# Patient Record
Sex: Female | Born: 1985 | Race: White | Hispanic: No | Marital: Single | Smoking: Never smoker
Health system: Southern US, Community
[De-identification: ages and names within clinical notes are randomized; demographics above are authoritative.]

## PROBLEM LIST (undated history)

## (undated) DIAGNOSIS — Z0000 Encounter for general adult medical examination without abnormal findings: Secondary | ICD-10-CM

## (undated) HISTORY — PX: CHOLECYSTECTOMY: SHX55

## (undated) HISTORY — DX: Encounter for general adult medical examination without abnormal findings: Z00.00

---

## 2010-05-12 ENCOUNTER — Ambulatory Visit: Payer: Self-pay | Admitting: Family Medicine

## 2011-09-28 IMAGING — CR DG CHEST 1V
1 series · 1 of 1 positions shown · non-contrast
Comparison: none

REASON FOR EXAM: +PPD
COMMENTS:

[view not recorded]
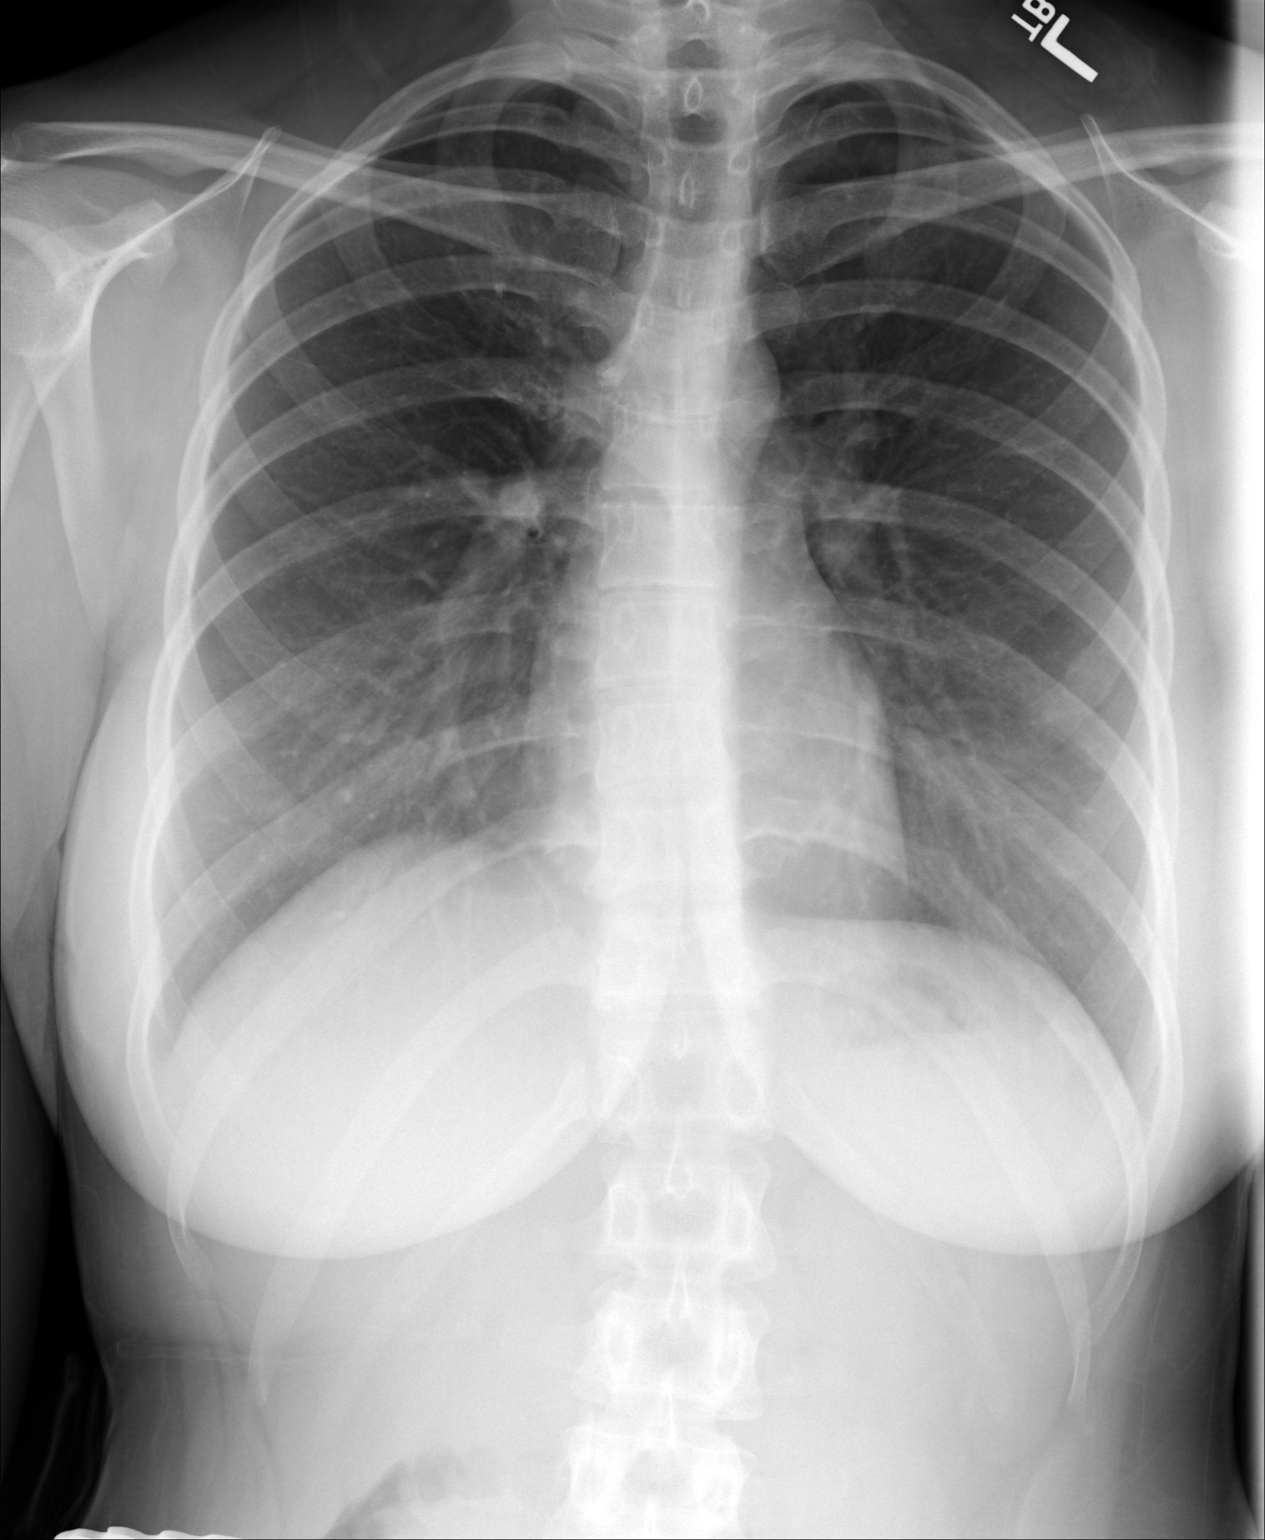

[1 of 1 positions shown; findings below may reference images not displayed]

PROCEDURE:     DXR - DXR CHEST 1 VIEWAP OR PA  - May 12, 2010  [DATE]

RESULT:     PA view of the chest shows the lung fields to be clear. The
heart, mediastinal and osseous structures show no acute changes. Attention
to the lung apices shows no infiltrate or cystic change suspicious for
tuberculosis. Incidental note is made of a slight thoracolumbar scoliosis.
IMPRESSION: 1.  No acute changes are identified.
2.  Incidental note is made of a slight thoracolumbar scoliosis.

## 2016-10-13 ENCOUNTER — Ambulatory Visit (INDEPENDENT_AMBULATORY_CARE_PROVIDER_SITE_OTHER): Payer: Self-pay | Admitting: Nurse Practitioner

## 2016-10-13 ENCOUNTER — Encounter: Payer: Self-pay | Admitting: Nurse Practitioner

## 2016-10-13 VITALS — BP 123/72 | HR 69 | Temp 98.3°F | Resp 16 | Ht 69.3 in | Wt 205.0 lb

## 2016-10-13 DIAGNOSIS — G4700 Insomnia, unspecified: Secondary | ICD-10-CM | POA: Diagnosis not present

## 2016-10-13 DIAGNOSIS — Z9189 Other specified personal risk factors, not elsewhere classified: Secondary | ICD-10-CM | POA: Diagnosis not present

## 2016-10-13 NOTE — Progress Notes (Signed)
Subjective:    Patient ID: Michele Hodge, female    DOB: 1985/12/25, 31 y.o.   MRN: [id number]  Michele Hodge is a 31 y.o. female presenting on 10/13/2016 for Establish Care (sleep study)   HPI   Over the last 2 years, Michele Hodge has had trouble sleeping with more difficulty over the last month.  Onset of trouble sleeping correlates with the same time she started gaining weight.  She gained between 40-45 pounds in 2 years, which she attributes relationship stress.  Since January 2018, she has lost 7 lbs on her home scale, but hasn't had any changes in her sleeping or snoring.  Highest weight 215 lbs; weight 2 years ago: 170-175  She does experience snoring, which has been reported by her former boyfriend and sister.  In January, her sister told her she was choking and it sounded like she stopped breathing.  Her former boyfriend had mentioned that she stopped breathing as well.  She has difficulty falling asleep and waking up in the night, but has no problems falling back asleep.  When she wakes up at night she feels short of breath and has a sore throat.  She has some daytime sleepiness (see Epworth) and is struggling to stay awake when driving on highway and at stoplights, but has not actually fallen asleep.  Pt describes it as "heavy eyes" and "I struggle to keep my eyes open."  [place] from [place] for [place], so this is worrisome to her.  She also notices difficulty focusing and she is "not present at work over the last month."   She has started waking up with headaches 1-2 x per week over the last several weeks that go away without taking any medication.   She denies increased moodiness or irritability, decreased libido, nocturia,and a history of GERD, PCOS, palpitations, or hypertension.  She does have a regular nighttime routine and goes to bed around the same time each night. She uses her cell phone up to the point of falling asleep.  Uses a sleep app which shows  her she has poor sleep quality.  The app is on her cell phone and sits by bed for monitoring restlessness - no physical monitor on body.  She purchased an adjustable bed and regularly sleeps on 1 pillow with the HOB elevated 10-20 degrees.  She admits she is a restless, side-sleeper.  Her mom uses a CPAP.   Epworth Sleepiness Scale Total Score: 17 Sitting and reading - 3 Watching TV - 2 Sitting inactive in a public place - 2 As a passenger in a car for an hour without a break - 3 Lying down to rest in the afternoon when circumstances permit - 3 Sitting and talking to someone - 1 Sitting quietly after a lunch without alcohol - 2 In a car, while stopped for a few minutes in traffic - 1  Chance of dozing off under normal circumstances 0 = Never 1 = Slight chance 2 = Moderate chance 3 = High chance  0-7: It is unlikely that you are abnormally sleepy. 8-9: Average amount of daytime sleepiness. >9: POSITIVE - Recommend further evaluation, sleep specialist or sleep study (>16-24 = severe)   History reviewed. No pertinent past medical history. History reviewed. No pertinent surgical history. Social History   Social History  . Marital status: Single    Spouse name: N/A  . Number of children: N/A  . Years of education: N/A   Occupational History  . Not  on file.   Social History Main Topics  . Smoking status: Never Smoker  . Smokeless tobacco: Current User  . Alcohol use 1.2 oz/week    2 Glasses of wine per week  . Drug use: No  . Sexual activity: No   Other Topics Concern  . Not on file   Social History Narrative  . No narrative on file   Family History  Problem Relation Age of Onset  . Diabetes Father 32  . Stroke Maternal Grandmother   . CAD Maternal Grandfather   . Stroke Paternal Grandmother   . Schizophrenia Brother 20  . Breast cancer Neg Hx   . Colon cancer Neg Hx   . Ovarian cancer Neg Hx    No current outpatient prescriptions on file prior to visit.    No current facility-administered medications on file prior to visit.    PAP and contraception management are done by her OBGYN and will continue there.  Review of Systems  Constitutional: Negative.   HENT: Positive for sore throat.   Eyes: Negative.   Respiratory: Negative.        Daytime/awake symptoms are negative.  See HPI for nocturnal/asleep symptoms.  Cardiovascular: Negative.   Gastrointestinal: Negative.   Endocrine: Negative.   Genitourinary: Negative.   Musculoskeletal: Negative.   Skin: Negative.   Allergic/Immunologic: Positive for environmental allergies.  Neurological: Positive for headaches.   Per HPI unless specifically indicated above      Objective:    BP 123/72 (BP Location: Right Arm, Patient Position: Sitting, Cuff Size: Large)   Pulse 69   Temp 98.3 F (36.8 C) (Oral)   Resp 16   Ht 5' 9.3" (1.76 m)   Wt 205 lb (93 kg)   BMI 30.01 kg/m   Wt Readings from Last 3 Encounters:  10/13/16 205 lb (93 kg)    Physical Exam  Constitutional: She is oriented to person, place, and time. She appears well-developed and well-nourished.  HENT:  Head: Normocephalic and atraumatic.  Right Ear: External ear normal.  Left Ear: External ear normal.  Nose: Nose normal.  Mouth/Throat: Oropharynx is clear and moist.  Mallampati score 3  Eyes: Conjunctivae and EOM are normal. Pupils are equal, round, and reactive to light.  Neck: Normal range of motion. Neck supple. No JVD present. No thyromegaly present.  Neck circumference 35.5 cm/14 in  Cardiovascular: Normal rate, regular rhythm, normal heart sounds and intact distal pulses.   Pulmonary/Chest: Effort normal and breath sounds normal.  Musculoskeletal: Normal range of motion.  Lymphadenopathy:    She has no cervical adenopathy.  Neurological: She is alert and oriented to person, place, and time. She has normal reflexes. No cranial nerve deficit. She exhibits normal muscle tone. Coordination normal.  Skin: Skin  is warm and dry.  Psychiatric: She has a normal mood and affect. Her behavior is normal. Judgment and thought content normal.    Epworth Sleepiness Scale  STOP-Bang OSA scoring: Intermediate OSA risk Snoring yes 1  Tiredness yes 1  Observed apneas yes 1  Pressure HTN no 0  BMI > 35 kg/m2 no 0  Age > 50  no 0  Neck (female >17 in; Female >16 in)  no 0  Gender female no 0  OSA risk low (0-2)  OSA risk intermediate (3-4)  OSA risk high (5+)  Total: 3    Assessment & Plan:   Problem List Items Addressed This Visit    None    Visit Diagnoses  At risk for obstructive sleep apnea  Insomnia, unspecified type    -  Primary   Relevant Orders   Nocturnal polysomnography (NPSG)  1. Referral for sleep study to evaluate nocturnal apnea 2. Discussed sleep hygiene, emphasizing the end of screen time 30 minutes before bed.        Follow up plan: Return in about 2 months (around 12/13/2016) for annual physical, follow-up sleep.  Wilhelmina Mcardle, DNP, AGNP-BC Adult Gerontology Nurse Practitioner [facility] 10/13/2016, 5:26 PM

## 2016-10-13 NOTE — Patient Instructions (Signed)
Michele Hodge, Thank you for coming in to clinic today.  1. Your insomnia is likely related to sleep apnea, but that diagnosis cannot be made without a sleep study.  2. Sleep Hygiene Recommendations to promote healthy sleep in all patients, especially if symptoms of insomnia are worsening. Due to the nature of sleep rhythms, if your body gets "out of rhythm", it may take some time before your sleep cycle can be "reset".  Please try to follow as many of the following tips as you can, usually there are only a few of these are the primary cause of the problem.  ?To reset your sleep rhythm, go to bed and get up at the same time every day ?Sleep only long enough to feel rested and then get out of bed ?Do not try to force yourself to sleep. If you can't sleep, get out of bed and try again later.  ?Have coffee, tea, and other foods that have caffeine only in the morning ?Exercise several days a week, but not right before bed ?If you drink alcohol, avoid alcohol in the late afternoon, evening, and bedtime ?If you smoke, avoid smoking, especially in the evening  ?Avoid watching TV or looking at phones, computers, or reading devices ("e-books") that give off light at least 30 minutes before bed. This artificial light sends "awake signals" to your brain and can make it harder to fall asleep. ?Keep your bedroom dark, cool, quiet, and free of reminders of other things that cause you stress ?Try your best to solve or at least address your problems before you go to bed  Please schedule a follow-up appointment with Michele Hodge, Michele Hodge in 1 month for annual wellness.  If you have any other questions or concerns, please feel free to call the clinic or send a message through MyChart. You may also schedule an earlier appointment if necessary.  Michele McardleLauren Everlie Eble, DNP, Michele Hodge-BC Adult Gerontology Nurse Practitioner [facility], [facility]    Sleep Studies A sleep study (polysomnogram) is a series of tests  done while you are sleeping. It can show how well you sleep. This can help your health care provider diagnose a sleep disorder and show how severe your sleep disorder is. A sleep study may lead to treatment that will help you sleep better and prevent other medical problems caused by poor sleep. If you have a sleep disorder, you may also be at risk for:  Sleep-related accidents.  High blood pressure.  Heart disease.  Stroke.  Other medical conditions. Sleep disorders are common. Your health care provider may suspect a sleep disorder if you:  Have loud snoring most nights.  Have brief periods when you stop breathing at night.  Feel sleepy on most days.  Fall asleep suddenly during the day.  Have trouble falling asleep or staying asleep.  Feel like you need to move your legs when trying to fall asleep.  Have dreams that seem very real shortly after falling asleep.  Feel like you cannot move when you first wake up. Which tests will I need to have? Most sleep studies last all night and include these tests:  Recordings of your brain activity.  Recordings of your eye movements.  Recording of your heart rate and rhythm.  Blood pressure readings.  Readings of the amount of oxygen in your blood.  Measurements of your chest and belly movement as you breathe during sleep. If you have signs of the sleep disorder called sleep apnea during your test, you may get  a mask to wear for the second half of the night.  The mask provides continuous positive airway pressure (CPAP). This may improve sleep apnea significantly.  You will then have all tests done again with the mask in place to see if your measurements and recordings change. How are sleep studies done? Most sleep studies are done over one full night of sleep.  You will arrive at the study center in the evening and can go home in the morning.  Bring your pajamas and toothbrush.  Do not have caffeine on the day of your sleep  study.  Your health care provider will let you know if you need to stop taking any of your regular medicines before the test. To do the tests included in a polysomnogram, you will have:  Round, sticky patches with sensors attached to recording wires (electrodes) placed on your scalp, face, chest, and limbs.  Wires from all the electrodes and sensors run from your bed to a computer. The wires can be taken off and put back on if you need to get out of bed to go to the bathroom.  A sensor placed over your nose to measure airflow.  A finger clip put on one finger to measure your blood oxygen level.  A belt around your belly and a belt around your chest to measure breathing movements. Where are sleep studies done? Sleep studies are done at sleep centers. A sleep center may be inside a hospital, office, or clinic. The room where you have the study may look like a hospital room or a hotel room. The health care providers doing the study may come in and out of the room during the study. Most of the time, they will be in another room monitoring your test. How is information from sleep studies helpful? A polysomnogram can be used along with your medical history and a physical exam to diagnose conditions, such as:  Sleep apnea.  Restless legs syndrome.  Sleep-related seizure disorders.  Sleep-related movement disorders. A medical doctor who specializes in sleep will evaluate your sleep study. The specialist will share the results with your primary health care provider. Treatments based on your sleep study may include:  Improving your sleep habits (sleep hygiene).  Wearing a CPAP mask.  Wearing an oral device at night to improve breathing and reduce snoring.  Taking medicine for:  Restless legs syndrome.  Sleep-related seizure disorder.  Sleep-related movement disorder. This information is not intended to replace advice given to you by your health care provider. Make sure you discuss any  questions you have with your health care provider. Document Released: 01/08/2003 Document Revised: 02/28/2016 Document Reviewed: 09/09/2013 Elsevier Interactive Patient Education  2017 [employer].

## 2016-10-13 NOTE — Progress Notes (Signed)
I have reviewed this encounter including the documentation in this note and/or discussed this patient with the provider, Wilhelmina McardleLauren Kennedy, AGPCNP-BC. I am certifying that I agree with the content of this note as supervising physician.  Saralyn PilarAlexander Cyan Clippinger, DO [facility] 10/13/2016, 6:24 PM

## 2016-10-25 ENCOUNTER — Ambulatory Visit (HOSPITAL_BASED_OUTPATIENT_CLINIC_OR_DEPARTMENT_OTHER): Payer: Self-pay

## 2016-11-03 ENCOUNTER — Telehealth: Payer: Self-pay | Admitting: *Deleted

## 2016-11-03 NOTE — Telephone Encounter (Signed)
Patient was approved for in-lab sleep study. Patient has appt scheduled but cancelled. Patient was informed home sleep study declined by insurance. She can call [facility] back to reschedule if she desires.

## 2016-12-13 ENCOUNTER — Encounter: Payer: BC Managed Care – PPO | Admitting: Nurse Practitioner

## 2016-12-26 ENCOUNTER — Encounter: Payer: Self-pay | Admitting: Nurse Practitioner

## 2016-12-26 ENCOUNTER — Ambulatory Visit (INDEPENDENT_AMBULATORY_CARE_PROVIDER_SITE_OTHER): Payer: BC Managed Care – PPO | Admitting: Nurse Practitioner

## 2016-12-26 VITALS — BP 110/59 | HR 62 | Ht 70.5 in | Wt 213.0 lb

## 2016-12-26 DIAGNOSIS — Z0000 Encounter for general adult medical examination without abnormal findings: Secondary | ICD-10-CM

## 2016-12-26 DIAGNOSIS — Z6830 Body mass index (BMI) 30.0-30.9, adult: Secondary | ICD-10-CM | POA: Diagnosis not present

## 2016-12-26 DIAGNOSIS — E663 Overweight: Secondary | ICD-10-CM | POA: Insufficient documentation

## 2016-12-26 DIAGNOSIS — Z7689 Persons encountering health services in other specified circumstances: Secondary | ICD-10-CM | POA: Diagnosis not present

## 2016-12-26 MED ORDER — SCOPOLAMINE 1 MG/3DAYS TD PT72: 1.0000 | MEDICATED_PATCH | TRANSDERMAL | 0 refills | Status: AC

## 2016-12-26 NOTE — Patient Instructions (Addendum)
Michele Hodge, Thank you for coming in to clinic today.  Normal physical exam today.  You will be due for FASTING BLOOD WORK this week (no food or drink after midnight before, only water or coffee without cream/sugar on the morning of)  Make an appointment for labs between 8-12am this week.  For Lab Results, once available within 2-3 days of blood draw, you can can log in to MyChart online to view your results and a brief explanation. Also, we can discuss results at next follow-up visit.  Recommend MyFitnessPal  - Calorie reduction about 500 calories from your baseline.  Please schedule a follow-up appointment with Michele Hodge, AGNP to Return in about 1 year (around 12/26/2017) for annual physical.  If you have any other questions or concerns, please feel free to call the clinic or send a message through MyChart. You may also schedule an earlier appointment if necessary.  Michele McardleLauren Rufus Beske, DNP, AGNP-BC Adult Gerontology Nurse Practitioner [facility], [facility]

## 2016-12-26 NOTE — Progress Notes (Signed)
Subjective:    Patient ID: Michele Hodge, female    DOB: June 10, 1986, 31 y.o.   MRN: [id number]  Michele Hodge is a 31 y.o. female presenting on 12/26/2016 for Annual Exam   HPI Sleep study has not been completed r/t cost.  In home study not covered. In office study covered, but w/ high copay for pt.  Currently feels better and is not as concerned about sleep apnea.  Symptoms improved after some weight loss and more exercise.  Patient has been feeling well.  They have acute concerns today as follows: - LMP started Saturday and is currently menstruating.  "Intense period pain" in April w/ cramping. Not repeated since.  Never on hormonal contraception - uses condoms.  Takes acetaminophen as needed for menstrual cramps.   - Cruise planned this summer for 6 nights: prior cruise of shorter duration w/ nausea and pt has concern about nausea again.  Dramamine not helpful in past for cruise.  Wants to discuss nausea medication options.  Health Maintenance Weight/BMI: High BMI - w/ desire to lose weight.  Lost a few pounds between last visit and her birthday, but has regained the weight and more (up 8 lbs since March visit) w/ dietary indiscretions. Physical activity: Zumba 1-2 x per week, workout at home 1x per week Diet: Felt better when eating more vegetables.  Now portion control w/ dietary indiscretions. Seatbelt: always Sunscreen: Always PAP: August 2017 Tetanus: 2011 Gardasil: not received - now not applicable r/t age  Encounter for STD testing Pt w/ no testing since last sexual partner.  Higher risk sexual behaviors w/ more than one partner in last 6 months and concern for partner to have been sexually active w/ another person.  Denies any vaginal discharge, odor, sores except single "pimple" noted recently on labia that is no longer present.  Past Medical History:  Diagnosis Date  . Healthy adult on routine physical examination     No prior surgeries.  Social History   Social  History  . Marital status: Single    Spouse name: N/A  . Number of children: N/A  . Years of education: N/A   Occupational History  . Not on file.   Social History Main Topics  . Smoking status: Never Smoker  . Smokeless tobacco: Current User  . Alcohol use 0.6 oz/week    1 Glasses of wine per week     Comment: occasional use  . Drug use: No  . Sexual activity: No   Other Topics Concern  . Not on file   Social History Narrative  . No narrative on file   Family History  Problem Relation Age of Onset  . Diabetes Father 61  . Stroke Maternal Grandmother   . CAD Maternal Grandfather   . Stroke Paternal Grandmother   . Schizophrenia Brother 20  . Breast cancer Neg Hx   . Colon cancer Neg Hx   . Ovarian cancer Neg Hx    No current outpatient prescriptions on file prior to visit.   No current facility-administered medications on file prior to visit.     Review of Systems  Constitutional: Negative.   HENT: Positive for tinnitus.        Tinnitus L>R  Eyes: Negative.   Respiratory: Negative.   Cardiovascular: Negative.   Gastrointestinal: Negative.   Endocrine: Negative.   Genitourinary: Negative.   Musculoskeletal: Negative.   Skin: Negative.   Allergic/Immunologic: Negative.   Neurological: Negative.   Hematological: Negative.   Psychiatric/Behavioral: Negative.  Per HPI unless specifically indicated above     Objective:    BP (!) 110/59 (BP Location: Right Arm, Patient Position: Sitting, Cuff Size: Large)   Pulse 62   Ht 5' 10.5" (1.791 m)   Wt 213 lb (96.6 kg)   LMP 12/24/2016   BMI 30.13 kg/m    Wt Readings from Last 3 Encounters:  12/26/16 213 lb (96.6 kg)  10/13/16 205 lb (93 kg)    Physical Exam  Constitutional: She is oriented to person, place, and time. She appears well-developed and well-nourished. No distress.  HENT:  Head: Normocephalic and atraumatic.  Right Ear: External ear normal.  Left Ear: External ear normal.  Nose: Nose  normal.  Mouth/Throat: Oropharynx is clear and moist. No oropharyngeal exudate.  Eyes: Conjunctivae and EOM are normal. Pupils are equal, round, and reactive to light.  Neck: Normal range of motion. Neck supple. No JVD present. No tracheal deviation present. No thyromegaly present.  Cardiovascular: Normal rate, regular rhythm, normal heart sounds and intact distal pulses.   Pulmonary/Chest: Effort normal and breath sounds normal. No respiratory distress.  Abdominal: Soft. Bowel sounds are normal. She exhibits no distension.  Musculoskeletal: Normal range of motion.  Lymphadenopathy:    She has no cervical adenopathy.  Neurological: She is alert and oriented to person, place, and time. No cranial nerve deficit.  Skin: Skin is warm and dry.  Psychiatric: She has a normal mood and affect. Her behavior is normal. Judgment and thought content normal.  Vitals reviewed.   No results found for this or any previous visit.    Assessment & Plan:   Problem List Items Addressed This Visit      Other   BMI 30.0-30.9,adult    Pt w/ weight gain of 8 lbs since March and obese BMI of 30.1 today.  Plan: 1. Reviewed diet and exercise plan. Discussed importance of committing to this plan daily. 2. Encouraged 1/2 - 1 lb weight loss per week. 3. Follow up w/ annual physical in 1 year.       Other Visit Diagnoses    Encounter for annual physical exam    -  Primary Normal well adult exam without new abnormal findings and no recent labs in past.  Denies prior cholesterol screening.  Plan: 1. Check Screening labs for thyroid function, cholesterol, CMP, kidney and liver function.   2. Check STD screening labs for HIV, RPR.   Relevant Orders   TSH   CBC with Differential/Platelet   Lipid panel   Comprehensive metabolic panel   HIV antibody   RPR   Encounter for assessment of STD exposure     Pt with high risk sexual encounters. No recent testing.  Plan: 1. Check GC/Chlamydia probe. 2. HIV  antibody, RPR serum labs.   Relevant Orders   HIV antibody   RPR   GC/Chlamydia Probe Amp      Meds ordered this encounter  Medications  . scopolamine (TRANSDERM-SCOP, 1.5 MG,) 1 MG/3DAYS    Sig: Place 1 patch (1.5 mg total) onto the skin every 3 (three) days. 12 hours prior to cruise place first patch behind ear    Dispense:  3 patch    Refill:  0    Order Specific Question:   Supervising Provider    Answer:   Smitty CordsKARAMALEGOS, ALEXANDER J [[id number]]     Follow up plan: Return in about 1 year (around 12/26/2017) for annual physical.  Michele McardleLauren Damondre Pfeifle, DNP, AGPCNP-BC Adult Gerontology Primary Care Nurse Practitioner  [facility] 12/26/2016, 12:08 PM

## 2016-12-26 NOTE — Progress Notes (Signed)
I have reviewed this encounter including the documentation in this note and/or discussed this patient with the provider, Wilhelmina McardleLauren Kennedy, AGPCNP-BC. I am certifying that I agree with the content of this note as supervising physician.  Saralyn PilarAlexander Adiva Boettner, DO [facility] 12/26/2016, 12:26 PM

## 2016-12-26 NOTE — Assessment & Plan Note (Addendum)
Pt w/ weight gain of 8 lbs since March and obese BMI of 30.1 today.  Plan: 1. Reviewed diet and exercise plan. Discussed importance of committing to this plan daily. 2. Encouraged 1/2 - 1 lb weight loss per week. 3. Follow up w/ annual physical in 1 year.

## 2016-12-27 LAB — GC/CHLAMYDIA PROBE AMP
CT Probe RNA: NOT DETECTED
GC Probe RNA: NOT DETECTED

## 2016-12-28 ENCOUNTER — Other Ambulatory Visit: Payer: BC Managed Care – PPO

## 2016-12-28 LAB — CBC WITH DIFFERENTIAL/PLATELET
Basophils Absolute: 69 cells/uL (ref 0–200)
Basophils Relative: 1 %
Eosinophils Absolute: 138 cells/uL (ref 15–500)
Eosinophils Relative: 2 %
HCT: 41.6 % (ref 35.0–45.0)
Hemoglobin: 13.9 g/dL (ref 11.7–15.5)
Lymphocytes Relative: 28 %
Lymphs Abs: 1932 cells/uL (ref 850–3900)
MCH: 30.7 pg (ref 27.0–33.0)
MCHC: 33.4 g/dL (ref 32.0–36.0)
MCV: 91.8 fL (ref 80.0–100.0)
MPV: 8.6 fL (ref 7.5–12.5)
Monocytes Absolute: 552 cells/uL (ref 200–950)
Monocytes Relative: 8 %
Neutro Abs: 4209 cells/uL (ref 1500–7800)
Neutrophils Relative %: 61 %
Platelets: 273 10*3/uL (ref 140–400)
RBC: 4.53 MIL/uL (ref 3.80–5.10)
RDW: 13.2 % (ref 11.0–15.0)
WBC: 6.9 10*3/uL (ref 3.8–10.8)

## 2016-12-28 LAB — COMPREHENSIVE METABOLIC PANEL
ALT: 25 U/L (ref 6–29)
AST: 22 U/L (ref 10–30)
Albumin: 4.4 g/dL (ref 3.6–5.1)
Alkaline Phosphatase: 47 U/L (ref 33–115)
BUN: 14 mg/dL (ref 7–25)
CO2: 24 mmol/L (ref 20–31)
Calcium: 9.3 mg/dL (ref 8.6–10.2)
Chloride: 104 mmol/L (ref 98–110)
Creat: 0.82 mg/dL (ref 0.50–1.10)
Glucose, Bld: 93 mg/dL (ref 65–99)
Potassium: 4.1 mmol/L (ref 3.5–5.3)
Sodium: 139 mmol/L (ref 135–146)
Total Bilirubin: 1.1 mg/dL (ref 0.2–1.2)
Total Protein: 7.3 g/dL (ref 6.1–8.1)

## 2016-12-28 LAB — LIPID PANEL
Cholesterol: 213 mg/dL — ABNORMAL HIGH (ref <200)
HDL: 57 mg/dL (ref >50)
LDL Cholesterol: 123 mg/dL — ABNORMAL HIGH (ref <100)
Total CHOL/HDL Ratio: 3.7 Ratio (ref <5.0)
Triglycerides: 166 mg/dL — ABNORMAL HIGH (ref <150)
VLDL: 33 mg/dL — ABNORMAL HIGH (ref <30)

## 2016-12-28 LAB — TSH: TSH: 1.87 mIU/L

## 2016-12-29 LAB — HIV ANTIBODY (ROUTINE TESTING W REFLEX): HIV 1&2 Ab, 4th Generation: NONREACTIVE

## 2016-12-29 LAB — RPR

## 2017-04-17 ENCOUNTER — Ambulatory Visit (INDEPENDENT_AMBULATORY_CARE_PROVIDER_SITE_OTHER): Payer: BC Managed Care – PPO | Admitting: Nurse Practitioner

## 2017-04-17 ENCOUNTER — Encounter: Payer: Self-pay | Admitting: Nurse Practitioner

## 2017-04-17 VITALS — BP 117/74 | HR 62 | Temp 98.3°F | Ht 70.5 in | Wt 212.0 lb

## 2017-04-17 DIAGNOSIS — Z6830 Body mass index (BMI) 30.0-30.9, adult: Secondary | ICD-10-CM | POA: Diagnosis not present

## 2017-04-17 DIAGNOSIS — K219 Gastro-esophageal reflux disease without esophagitis: Secondary | ICD-10-CM | POA: Diagnosis not present

## 2017-04-17 DIAGNOSIS — Z7689 Persons encountering health services in other specified circumstances: Secondary | ICD-10-CM

## 2017-04-17 DIAGNOSIS — Z30011 Encounter for initial prescription of contraceptive pills: Secondary | ICD-10-CM | POA: Diagnosis not present

## 2017-04-17 LAB — POCT WET PREP (WET MOUNT): Trichomonas Wet Prep HPF POC: ABSENT

## 2017-04-17 MED ORDER — NORETHIN-ETH ESTRAD-FE BIPHAS 1 MG-10 MCG / 10 MCG PO TABS: 1.0000 | ORAL_TABLET | Freq: Every day | ORAL | 11 refills | Status: DC

## 2017-04-17 MED ORDER — RANITIDINE HCL 75 MG PO TABS: 75.0000 mg | ORAL_TABLET | Freq: Two times a day (BID) | ORAL | 0 refills | Status: DC

## 2017-04-17 NOTE — Patient Instructions (Addendum)
Michele Hodge, Thank you for coming in to clinic today.  1. For acid reflux: - Limit meal especially dinner portions and avoid alcohol. - Take Zantac 75 mg twice daily for 2 weeks. Then, stop and use tums as needed. - Call clinic for additional instructions if needed.  2. For birth control: - Start lo-loestrin (or generic) pill once daily.  Start with new pill pack when complete.  Use backup birth control for first cycle.  3. For STD screening: - Will send copy of results to mychart.   Please schedule a follow-up appointment with Wilhelmina Mcardle, AGNP. Return in about 3 months (around 07/18/2017) for GERD.   If you have any other questions or concerns, please feel free to call the clinic or send a message through MyChart. You may also schedule an earlier appointment if necessary.  You will receive a survey after today's visit either digitally by e-mail or paper by [employer]. Your experiences and feedback matter to [place].  Please respond so we know how we are doing as we provide care for you.   Wilhelmina Mcardle, DNP, AGNP-BC Adult Gerontology Nurse Practitioner [facility], [facility]   Food Choices for Gastroesophageal Reflux Disease, Adult When you have gastroesophageal reflux disease (GERD), the foods you eat and your eating habits are very important. Choosing the right foods can help ease the discomfort of GERD. Consider working with a diet and nutrition specialist (dietitian) to help you make healthy food choices. What general guidelines should I follow? Eating plan  Choose healthy foods low in fat, such as fruits, vegetables, whole grains, low-fat dairy products, and lean meat, fish, and poultry.  Eat frequent, small meals instead of three large meals each day. Eat your meals slowly, in a relaxed setting. Avoid bending over or lying down until 2-3 hours after eating.  Limit high-fat foods such as fatty meats or fried foods.  Limit your intake of oils, butter, and shortening to  less than 8 teaspoons each day.  Avoid the following: ? Foods that cause symptoms. These may be different for different people. Keep a food diary to keep track of foods that cause symptoms. ? Alcohol. ? Drinking large amounts of liquid with meals. ? Eating meals during the 2-3 hours before bed.  Cook foods using methods other than frying. This may include baking, grilling, or broiling. Lifestyle   Maintain a healthy weight. Ask your health care provider what weight is healthy for you. If you need to lose weight, work with your health care provider to do so safely.  Exercise for at least 30 minutes on 5 or more days each week, or as told by your health care provider.  Avoid wearing clothes that fit tightly around your waist and chest.  Do not use any products that contain nicotine or tobacco, such as cigarettes and e-cigarettes. If you need help quitting, ask your health care provider.  Sleep with the head of your bed raised. Use a wedge under the mattress or blocks under the bed frame to raise the head of the bed. What foods are not recommended? The items listed may not be a complete list. Talk with your dietitian about what dietary choices are best for you. Grains Pastries or quick breads with added fat. [place] toast. Vegetables Deep fried vegetables. [place] fries. Any vegetables prepared with added fat. Any vegetables that cause symptoms. For some people this may include tomatoes and tomato products, chili peppers, onions and garlic, and horseradish. Fruits Any fruits prepared with  added fat. Any fruits that cause symptoms. For some people this may include citrus fruits, such as oranges, grapefruit, pineapple, and lemons. Meats and other protein foods High-fat meats, such as fatty beef or pork, hot dogs, ribs, ham, sausage, salami and bacon. Fried meat or protein, including fried fish and fried chicken. Nuts and nut butters. Dairy Whole milk and chocolate milk. Sour cream. Cream.  Ice cream. Cream cheese. Milk shakes. Beverages Coffee and tea, with or without caffeine. Carbonated beverages. Sodas. Energy drinks. Fruit juice made with acidic fruits (such as orange or grapefruit). Tomato juice. Alcoholic drinks. Fats and oils Butter. Margarine. Shortening. Ghee. Sweets and desserts Chocolate and cocoa. Donuts. Seasoning and other foods Pepper. Peppermint and spearmint. Any condiments, herbs, or seasonings that cause symptoms. For some people, this may include curry, hot sauce, or vinegar-based salad dressings. Summary  When you have gastroesophageal reflux disease (GERD), food and lifestyle choices are very important to help ease the discomfort of GERD.  Eat frequent, small meals instead of three large meals each day. Eat your meals slowly, in a relaxed setting. Avoid bending over or lying down until 2-3 hours after eating.  Limit high-fat foods such as fatty meat or fried foods. This information is not intended to replace advice given to you by your health care provider. Make sure you discuss any questions you have with your health care provider. Document Released: 07/04/2005 Document Revised: 07/05/2016 Document Reviewed: 07/05/2016 Elsevier Interactive Patient Education  2017 Elsevier Inc.    Intrauterine Device Information An intrauterine device (IUD) is inserted into your uterus to prevent pregnancy. There are two types of IUDs available:  Copper IUD-This type of IUD is wrapped in copper wire and is placed inside the uterus. Copper makes the uterus and fallopian tubes produce a fluid that kills sperm. The copper IUD can stay in place for 10 years.  Hormone IUD-This type of IUD contains the hormone progestin (synthetic progesterone). The hormone thickens the cervical mucus and prevents sperm from entering the uterus. It also thins the uterine lining to prevent implantation of a fertilized egg. The hormone can weaken or kill the sperm that get into the uterus. One  type of hormone IUD can stay in place for 5 years, and another type can stay in place for 3 years.  Your health care provider will make sure you are a good candidate for a contraceptive IUD. Discuss with your health care provider the possible side effects. Advantages of an intrauterine device  IUDs are highly effective, reversible, long acting, and low maintenance.  There are no estrogen-related side effects.  An IUD can be used when breastfeeding.  IUDs are not associated with weight gain.  The copper IUD works immediately after insertion.  The hormone IUD works right away if inserted within 7 days of your period starting. You will need to use a backup method of birth control for 7 days if the hormone IUD is inserted at any other time in your cycle.  The copper IUD does not interfere with your female hormones.  The hormone IUD can make heavy menstrual periods lighter and decrease cramping.  The hormone IUD can be used for 3 or 5 years.  The copper IUD can be used for 10 years. Disadvantages of an intrauterine device  The hormone IUD can be associated with irregular bleeding patterns.  The copper IUD can make your menstrual flow heavier and more painful.  You may experience cramping and vaginal bleeding after insertion. This information is not  intended to replace advice given to you by your health care provider. Make sure you discuss any questions you have with your health care provider. Document Released: 06/07/2004 Document Revised: 12/10/2015 Document Reviewed: 12/23/2012 Elsevier Interactive Patient Education  2017 [employer].

## 2017-04-17 NOTE — Progress Notes (Signed)
Subjective:    Patient ID: Michele Hodge, female    DOB: 1986-03-22, 31 y.o.   MRN: [id number]  Michele Hodge is a 31 y.o. female presenting on 04/17/2017 for Contraception; Gastroesophageal Reflux (belching, heartburn ); and Hot Flashes (pt feel its related to caffiene )   HPI Contraception/STD Wants to start contraception, but unaware of which kind she would like to start.  Has had multiple partners since last STD check and also wants pan STD screening.  Not currently on contraception method other than condoms.  Has irregular use of condoms.  Pt does not wish to have a pregnancy at this time.   GERD Belching and heart burn and pain in chest. Has been taking zantac 75 mg once daily.  Is already avoiding dairy.   No stomach pain noted. - Is eating large dinner meal often close to bedtime.  Tries to stay awake x 2 hours after dinner.  Will lie down sometimes before 2 hours have passed.  Breakfast: small meal/cereal/yogurt Lunch: small meal/snack Dinner: big meals meat, veggie, bread/ starch  Hot Flashes: Not most pressing concern, pt willing to defer until another visit.     Social History  Substance Use Topics  . Smoking status: Never Smoker  . Smokeless tobacco: Current User  . Alcohol use 0.6 oz/week    1 Glasses of wine per week     Comment: occasional use    Review of Systems Per HPI unless specifically indicated above     Objective:    BP 117/74 (BP Location: Right Arm, Patient Position: Sitting, Cuff Size: Large)   Pulse 62   Temp 98.3 F (36.8 C) (Oral)   Ht 5' 10.5" (1.791 m)   Wt 212 lb (96.2 kg)   LMP 03/29/2017 (Approximate)   BMI 29.99 kg/m   Wt Readings from Last 3 Encounters:  04/17/17 212 lb (96.2 kg)  12/26/16 213 lb (96.6 kg)  10/13/16 205 lb (93 kg)    Physical Exam  General - overweight, well-appearing, NAD HEENT - Normocephalic, atraumatic, PERRL, EOMI, patent nares w/o congestion, oropharynx clear, MMM Neck - supple, non-tender, no  LAD, no thyromegaly, no carotid bruit Heart - RRR, no murmurs heard Lungs - Clear throughout all lobes, no wheezing, crackles, or rhonchi. Normal work of breathing. Abdomen - soft, NTND, no masses, no hepatosplenomegaly, active bowel sounds GU - Normal external female genitalia without lesions or fusion. Vaginal canal without lesions. Normal appearing cervix without lesions or friability. Physiologic discharge on exam. Bimanual exam without adnexal masses, enlarged uterus, or cervical motion tenderness. Extremeties - non-tender, no edema, cap refill < 2 seconds, peripheral pulses intact +2 bilaterally Skin - warm, dry, no rashes Neuro - awake, alert, oriented x3, normal gait Psych - Normal mood and affect, normal behavior    Results for orders placed or performed in visit on 04/17/17  GC/Chlamydia Probe Amp  Result Value Ref Range   Chlamydia trachomatis, NAA Negative Negative   Neisseria gonorrhoeae by PCR Negative Negative  HIV antibody  Result Value Ref Range   HIV Screen 4th Generation wRfx Non Reactive Non Reactive  RPR  Result Value Ref Range   RPR Ser Ql Non Reactive Non Reactive  POCT Wet Prep ([place]et [place])  Result Value Ref Range   Source Wet Prep POC vagina    WBC, Wet Prep HPF POC few    Bacteria Wet Prep HPF POC Few Few   BACTERIA WET PREP MORPHOLOGY POC     Clue Cells Wet Prep  HPF POC None None   Clue Cells Wet Prep Whiff POC     Yeast Wet Prep HPF POC None    KOH Wet Prep POC     Trichomonas Wet Prep HPF POC Absent Absent      Assessment & Plan:   Problem List Items Addressed This Visit      Digestive   Gastroesophageal reflux disease    Acute to subacute nature of GERD symptoms characterized by excessive belching.  No emergency signs noted.  Symptoms worsening since weight gain, eating habits changing w/ late evening large meal and increased alcohol consumption.  Plan: 1. Discussed lifestyle modification of smaller meal in evening (>2 hr prior to bed),  avoiding alcohol, weight loss. 2. Start Zantac 75 mg twice daily for 2 weeks.  If no improvement, will trial PPI.  If symptoms improve, is GERD. 3. Can consider GI referral if needed. 4. Follow up 3-6 months.     Relevant Medications   ranitidine (ZANTAC) 75 MG tablet     Other   BMI 30.0-30.9,adult    Pt w/ stable weight, but remains overweight w/ BMI 29.99 today.  Plan: 1. Reviewed diet and exercise plan. Discussed importance of committing to this plan daily. 2. Encouraged 1/2 - 1 lb weight loss per week and goal of 15 lbs or greater for helping to improve acid reflux symptoms. 3. Follow up w/ annual physical and in 3 months.       Other Visit Diagnoses    Encounter for initial prescription of contraceptive pills    -  Primary Pt desires contraception and wants to discuss options today.   Patient's last menstrual period was 03/29/2017 (approximate).  Pt has had unprotected sex since LMP.   Plan: 1. Discussed OCP, patch, ring, implanon, IUD (hormonal and copper) in detail w/ common side effects of each. 2. Pt prefers to start OCP at this time.  Order placed for lo loestrin once daily. 3. Educated on common side effects associated w/ all estrogen hormone use. 4. Obtain urine pregnancy test today. 5.  Followup as needed and in 1 year.    Relevant Medications   Norethindrone-Ethinyl Estradiol-Fe Biphas (LO LOESTRIN FE) 1 MG-10 MCG / 10 MCG tablet   Encounter for assessment of STD exposure     High risk exposure w/ > 1 partner in last 6 months.    Plan: 1. Obtain vaginal swab GC/Chlamydia, blood tests HIV, RPR. 2. Counseled on safe sex practices.  OCP does not prevent STD. 3. Follow up as needed.   Relevant Orders   HIV antibody (Completed)   RPR (Completed)   GC/Chlamydia Probe Amp (Completed)   POCT Wet Prep [facility]) (Completed)      Meds ordered this encounter  Medications  . Norethindrone-Ethinyl Estradiol-Fe Biphas (LO LOESTRIN FE) 1 MG-10 MCG / 10 MCG tablet     Sig: Take 1 tablet by mouth daily.    Dispense:  1 Package    Refill:  11    Order Specific Question:   Supervising Provider    Answer:   Smitty Cords [[id number]]      Follow up plan: Return in about 3 months (around 07/18/2017) for GERD.  A total of 40 minutes was spent face-to-face with this patient. Greater than 50% of this time was spent in counseling and coordination of care with the patient regarding high risk sexual behaviors, contraception options, GERD triggers/treatment (pharm and lifestyle).   Wilhelmina Mcardle, DNP, AGPCNP-BC Adult Gerontology Primary Care  Nurse Practitioner [facility] 04/17/2017, 5:09 PM

## 2017-04-17 NOTE — Assessment & Plan Note (Signed)
Pt w/ stable weight, but remains overweight w/ BMI 29.99 today.  Plan: 1. Reviewed diet and exercise plan. Discussed importance of committing to this plan daily. 2. Encouraged 1/2 - 1 lb weight loss per week and goal of 15 lbs or greater for helping to improve acid reflux symptoms. 3. Follow up w/ annual physical and in 3 months.

## 2017-04-20 LAB — GC/CHLAMYDIA PROBE AMP
Chlamydia trachomatis, NAA: NEGATIVE
Neisseria gonorrhoeae by PCR: NEGATIVE

## 2017-04-22 LAB — RPR: RPR Ser Ql: NONREACTIVE

## 2017-04-22 LAB — HIV ANTIBODY (ROUTINE TESTING W REFLEX): HIV Screen 4th Generation wRfx: NONREACTIVE

## 2017-04-24 NOTE — Assessment & Plan Note (Addendum)
Acute to subacute nature of GERD symptoms characterized by excessive belching.  No emergency signs noted.  Symptoms worsening since weight gain, eating habits changing w/ late evening large meal and increased alcohol consumption.  Plan: 1. Discussed lifestyle modification of smaller meal in evening (>2 hr prior to bed), avoiding alcohol, weight loss. 2. Start Zantac 75 mg twice daily for 2 weeks.  If no improvement, will trial PPI.  If symptoms improve, is GERD. 3. Can consider GI referral if needed. 4. Follow up 3-6 months.

## 2017-08-28 ENCOUNTER — Ambulatory Visit: Payer: BC Managed Care – PPO | Admitting: Nurse Practitioner

## 2017-08-29 ENCOUNTER — Encounter: Payer: Self-pay | Admitting: Nurse Practitioner

## 2017-08-29 ENCOUNTER — Ambulatory Visit: Payer: BC Managed Care – PPO | Admitting: Nurse Practitioner

## 2017-08-29 VITALS — BP 126/73 | HR 68 | Temp 98.7°F | Ht 70.5 in | Wt 224.4 lb

## 2017-08-29 DIAGNOSIS — Z114 Encounter for screening for human immunodeficiency virus [HIV]: Secondary | ICD-10-CM

## 2017-08-29 DIAGNOSIS — B9689 Other specified bacterial agents as the cause of diseases classified elsewhere: Secondary | ICD-10-CM | POA: Diagnosis not present

## 2017-08-29 DIAGNOSIS — N760 Acute vaginitis: Secondary | ICD-10-CM | POA: Diagnosis not present

## 2017-08-29 DIAGNOSIS — J029 Acute pharyngitis, unspecified: Secondary | ICD-10-CM

## 2017-08-29 DIAGNOSIS — Z113 Encounter for screening for infections with a predominantly sexual mode of transmission: Secondary | ICD-10-CM | POA: Diagnosis not present

## 2017-08-29 LAB — POCT RAPID STREP A (OFFICE): Rapid Strep A Screen: NEGATIVE

## 2017-08-29 MED ORDER — METRONIDAZOLE 0.75 % EX GEL: 1.0000 "application " | Freq: Every day | CUTANEOUS | 0 refills | Status: AC

## 2017-08-29 MED ORDER — METRONIDAZOLE 0.75 % VA GEL: 1.0000 | Freq: Every day | VAGINAL | 0 refills | Status: AC

## 2017-08-29 NOTE — Progress Notes (Signed)
Subjective:    Patient ID: Michele Hodge, female    DOB: September 14, 1985, 32 y.o.   MRN: [id number]  Michele Hodge is a 32 y.o. female presenting on 08/29/2017 for Sore Throat (difficulty swollen, tired & fatigue  x 5 days) and Rash (intermttent rash over the left nipple on the breast)   HPI Sore Throat Sore throat started Thursday gradual.  Tickle.  Hard to eat solid foods Friday.  Slept Sat-Sun.  Swollen tonsils/glands that are now improving.  - No cough. - No sneezing,  - No chills, fever, sweats - Improving today.     Rash left breast Started after 3 hr after epsom salt baths for relaxation.  Now has dots/patches on arms, occasionally on legs.  Uses 2 different bath bombs/candle in area.  Is sharing with roommate's bathtub.  Now has complete circle above L nipple.    STD Pt reports some mild vaginal discharge, no new odor, new bumps suprapubically in pubic hair.  - 1 sexual partner in last 6 months.  3 partners since last STD testing.   Social History   Tobacco Use  . Smoking status: Never Smoker  . Smokeless tobacco: Current User  Substance Use Topics  . Alcohol use: Yes    Alcohol/week: 0.6 oz    Types: 1 Glasses of wine per week    Comment: occasional use  . Drug use: No    Review of Systems Per HPI unless specifically indicated above     Objective:    BP 126/73 (BP Location: Right Arm, Patient Position: Sitting, Cuff Size: Large)   Pulse 68   Temp 98.7 F (37.1 C) (Oral)   Ht 5' 10.5" (1.791 m)   Wt 224 lb 6.4 oz (101.8 kg)   BMI 31.74 kg/m    Wt Readings from Last 3 Encounters:  08/29/17 224 lb 6.4 oz (101.8 kg)  04/17/17 212 lb (96.2 kg)  12/26/16 213 lb (96.6 kg)    Physical Exam  Constitutional: She is oriented to person, place, and time. She appears well-developed and well-nourished. No distress.  HENT:  Head: Normocephalic and atraumatic.  Right Ear: Hearing, tympanic membrane, external ear and ear canal normal.  Left Ear: Hearing, tympanic  membrane, external ear and ear canal normal.  Nose: Mucosal edema and rhinorrhea present. Right sinus exhibits no maxillary sinus tenderness and no frontal sinus tenderness. Left sinus exhibits no maxillary sinus tenderness and no frontal sinus tenderness.  Mouth/Throat: Uvula is midline and mucous membranes are normal. Posterior oropharyngeal erythema (mild) present. No oropharyngeal exudate (clear secretions), posterior oropharyngeal edema or tonsillar abscesses.  Neck: Normal range of motion. Neck supple.  Cardiovascular: Normal rate, regular rhythm, S1 normal, S2 normal, normal heart sounds and intact distal pulses.  Pulmonary/Chest: Effort normal and breath sounds normal. No respiratory distress.  Genitourinary:  Genitourinary Comments: Normal external female genitalia without lesions or fusion. Vaginal canal without lesions. Normal appearing cervix without lesions or friability. Thin, white discharge on exam with amine odor. Bimanual exam without adnexal masses, enlarged uterus, or cervical motion tenderness.  Lymphadenopathy:    She has cervical adenopathy.  Neurological: She is alert and oriented to person, place, and time.  Skin: Skin is warm and dry. Rash noted.     Psychiatric: She has a normal mood and affect. Her behavior is normal. Judgment and thought content normal.  Vitals reviewed.    Results for orders placed or performed in visit on 08/29/17  C. trachomatis/N. gonorrhoeae RNA  Result Value Ref Range  C. trachomatis RNA, TMA NOT DETECTED NOT DETECT   N. gonorrhoeae RNA, TMA NOT DETECTED NOT DETECT  C. trachomatis/N. gonorrhoeae RNA  Result Value Ref Range   C. trachomatis RNA, TMA NOT DETECTED NOT DETECT   N. gonorrhoeae RNA, TMA NOT DETECTED NOT DETECT  POCT rapid strep A  Result Value Ref Range   Rapid Strep A Screen Negative Negative      Assessment & Plan:   Problem List Items Addressed This Visit    None    Visit Diagnoses    Sore throat    -   Primary Consistent with acute pharyngitis, suspected viral etiology without constellation of viral symptoms and no sick contacts.  No significant history of strep throat, no recent antibiotics. Afebrile and no focal signs of infection (TM's clear, lungs clear). Tolerating PO well.  Cannot exclude oral STD.  Plan: 1. Rapid strep negative today. Reassurance, likely viral etiology of pharyngitis, should be self limited - Swab for oral GC/Chlamydia 2. Symptomatic control with NSAID / Tylenol regularly then PRN 3. Improve hydration, advance diet, warm herbal tea with honey 4. Consider trial Nasal saline, Flonase, anti-histamines to resolve post-nasal drip 5. Follow-up if worsening within 1 week and GC/Chlamydia are negative   Relevant Orders   POCT rapid strep A (Completed)   C. trachomatis/N. gonorrhoeae RNA (Completed)   Screening for STD (sexually transmitted disease)     Pt with continued high risk sexual behaviors and new concern for STD with increased vaginal discharge and odor.  Exam c/w BV and confirmed on Wet Prep.  No Trichomoniasis.  Plan: 1. GC/Chlamydia swab 2. Wet Prep 3. HIV/RPR testing as well. 4. Counseled on safe sex practices. 5. Followup as needed.    Relevant Orders   C. trachomatis/N. gonorrhoeae RNA (Completed)   C. trachomatis/N. gonorrhoeae RNA (Completed)   HIV antibody   RPR   Encounter for screening for HIV       Relevant Orders   HIV antibody   BV (bacterial vaginosis)     Clinically consistent with vaginal discharge on exam. Confirmed with BV on wet prep with clue cells and positive whiff test.  Plan: 1. Start Metronidazole 500mg  BID x 7 days (avoid alcohol) 2. Counseled on reducing recurrences with condom use, may try yogurt, probiotics, alternatively some patients are prone to recurrences with certain partners. 3. Additionally given rx Diflucan to fill if develops subsequent yeast infection       Meds ordered this encounter  Medications  .  metroNIDAZOLE (METROGEL) 0.75 % gel    Sig: Apply 1 application topically daily for 5 days.    Dispense:  45 g    Refill:  0    Order Specific Question:   Supervising Provider    Answer:   Smitty Cords [[id number]]  . metroNIDAZOLE (METROGEL VAGINAL) 0.75 % vaginal gel    Sig: Place 1 Applicatorful vaginally daily for 5 days.    Dispense:  70 g    Refill:  0      Follow up plan: Return 5-7 days if symptoms worsen or fail to improve.  Wilhelmina Mcardle, DNP, AGPCNP-BC Adult Gerontology Primary Care Nurse Practitioner [facility] 09/13/2017, 9:50 PM

## 2017-08-29 NOTE — Patient Instructions (Addendum)
Michele Hodge, Thank you for coming in to clinic today.  1. START reducing your bathing time.  Your skin patches are possibly dry skin.  Your breast appears to be tinea versicolor.  START using lotrimin cream topically 2 x daily for 14 days.  2. STD tests will be called to you when they result.  3. Reduce your sexual exposures.  4. START metronidazole place one applicatorful nightly for 5 nights.  Please schedule a follow-up appointment with Michele Hodge, AGNP. Return 5-7 days if symptoms worsen or fail to improve.  If you have any other questions or concerns, please feel free to call the clinic or send a message through MyChart. You may also schedule an earlier appointment if necessary.  You will receive a survey after today's visit either digitally by e-mail or paper by [employer] mail. Your experiences and feedback matter to us.  Please respond so we know how we are doing as we provide care for you.   Michele McardleLauren Zanasia Hickson, DNP, AGNP-BC Adult Gerontology Nurse Practitioner [facility], [facility]

## 2017-08-30 LAB — C. TRACHOMATIS/N. GONORRHOEAE RNA
C. trachomatis RNA, TMA: NOT DETECTED
C. trachomatis RNA, TMA: NOT DETECTED
N. gonorrhoeae RNA, TMA: NOT DETECTED
N. gonorrhoeae RNA, TMA: NOT DETECTED

## 2017-09-05 ENCOUNTER — Telehealth: Payer: Self-pay | Admitting: Nurse Practitioner

## 2017-09-05 ENCOUNTER — Encounter: Payer: Self-pay | Admitting: Nurse Practitioner

## 2017-09-05 DIAGNOSIS — B3731 Acute candidiasis of vulva and vagina: Secondary | ICD-10-CM

## 2017-09-05 DIAGNOSIS — B373 Candidiasis of vulva and vagina: Secondary | ICD-10-CM

## 2017-09-05 MED ORDER — FLUCONAZOLE 150 MG PO TABS: 150.0000 mg | ORAL_TABLET | Freq: Once | ORAL | 0 refills | Status: DC

## 2017-09-05 NOTE — Telephone Encounter (Signed)
Pt called, reported thick, "cottage cheese" like vaginal discharge.  Asked if she should continue metrogel.    - Pt should continue metrogel for final doses. - Will also send diflucan 150 mg x 1 dose.

## 2017-09-12 ENCOUNTER — Telehealth: Payer: Self-pay | Admitting: Nurse Practitioner

## 2017-09-12 DIAGNOSIS — B373 Candidiasis of vulva and vagina: Secondary | ICD-10-CM

## 2017-09-12 DIAGNOSIS — B3731 Acute candidiasis of vulva and vagina: Secondary | ICD-10-CM

## 2017-09-12 MED ORDER — FLUCONAZOLE 150 MG PO TABS: 150.0000 mg | ORAL_TABLET | Freq: Once | ORAL | 0 refills | Status: AC

## 2017-09-12 NOTE — Telephone Encounter (Signed)
Pt has yeast infection from medication and asked to have something called to pharmacy.  Her call back number is [phone]

## 2017-09-12 NOTE — Telephone Encounter (Signed)
Pt had not gotten original diflucan prescription 09/05/2017. [facility] did not have the prescription when she called there today.  Prescription for single dose resent today.

## 2017-09-13 ENCOUNTER — Encounter: Payer: Self-pay | Admitting: Nurse Practitioner

## 2017-11-16 ENCOUNTER — Encounter: Payer: Self-pay | Admitting: Nurse Practitioner

## 2017-12-29 ENCOUNTER — Emergency Department: Payer: BC Managed Care – PPO

## 2017-12-29 ENCOUNTER — Emergency Department
Admission: EM | Admit: 2017-12-29 | Discharge: 2017-12-29 | Disposition: A | Discharge status: Home / Self Care | Payer: BC Managed Care – PPO | Attending: Emergency Medicine | Admitting: Emergency Medicine

## 2017-12-29 ENCOUNTER — Encounter: Payer: Self-pay | Admitting: Emergency Medicine

## 2017-12-29 ENCOUNTER — Other Ambulatory Visit: Payer: Self-pay

## 2017-12-29 ENCOUNTER — Telehealth: Payer: Self-pay | Admitting: Nurse Practitioner

## 2017-12-29 DIAGNOSIS — Z79899 Other long term (current) drug therapy: Secondary | ICD-10-CM | POA: Insufficient documentation

## 2017-12-29 DIAGNOSIS — R1011 Right upper quadrant pain: Secondary | ICD-10-CM | POA: Diagnosis not present

## 2017-12-29 DIAGNOSIS — K8020 Calculus of gallbladder without cholecystitis without obstruction: Secondary | ICD-10-CM

## 2017-12-29 DIAGNOSIS — R1110 Vomiting, unspecified: Secondary | ICD-10-CM

## 2017-12-29 LAB — CBC WITH DIFFERENTIAL/PLATELET
BASOS ABS: 0.1 10*3/uL (ref 0–0.1)
BASOS PCT: 1 %
Eosinophils Absolute: 0 10*3/uL (ref 0–0.7)
Eosinophils Relative: 0 %
HEMATOCRIT: 41.8 % (ref 35.0–47.0)
HEMOGLOBIN: 14.2 g/dL (ref 12.0–16.0)
LYMPHS PCT: 15 %
Lymphs Abs: 1.9 10*3/uL (ref 1.0–3.6)
MCH: 30.5 pg (ref 26.0–34.0)
MCHC: 34 g/dL (ref 32.0–36.0)
MCV: 89.8 fL (ref 80.0–100.0)
Monocytes Absolute: 0.5 10*3/uL (ref 0.2–0.9)
Monocytes Relative: 4 %
NEUTROS ABS: 10.3 10*3/uL — AB (ref 1.4–6.5)
NEUTROS PCT: 80 %
Platelets: 304 10*3/uL (ref 150–440)
RBC: 4.66 MIL/uL (ref 3.80–5.20)
RDW: 12.4 % (ref 11.5–14.5)
WBC: 12.9 10*3/uL — AB (ref 3.6–11.0)

## 2017-12-29 LAB — COMPREHENSIVE METABOLIC PANEL
ALBUMIN: 4.5 g/dL (ref 3.5–5.0)
ALT: 28 U/L (ref 14–54)
AST: 24 U/L (ref 15–41)
Alkaline Phosphatase: 41 U/L (ref 38–126)
Anion gap: 10 (ref 5–15)
BILIRUBIN TOTAL: 0.6 mg/dL (ref 0.3–1.2)
BUN: 12 mg/dL (ref 6–20)
CO2: 24 mmol/L (ref 22–32)
CREATININE: 0.91 mg/dL (ref 0.44–1.00)
Calcium: 9.4 mg/dL (ref 8.9–10.3)
Chloride: 105 mmol/L (ref 101–111)
GFR calc Af Amer: >60 mL/min (ref >60)
GLUCOSE: 127 mg/dL — AB (ref 65–99)
Potassium: 3.5 mmol/L (ref 3.5–5.1)
Sodium: 139 mmol/L (ref 135–145)
TOTAL PROTEIN: 7.8 g/dL (ref 6.5–8.1)

## 2017-12-29 LAB — LIPASE, BLOOD: LIPASE: 31 U/L (ref 11–51)

## 2017-12-29 MED ORDER — MORPHINE SULFATE (PF) 4 MG/ML IV SOLN
4.0000 mg | Freq: Once | INTRAVENOUS | Status: AC
  Administered 2017-12-29: 4 mg via INTRAVENOUS

## 2017-12-29 MED ORDER — MORPHINE SULFATE (PF) 4 MG/ML IV SOLN
INTRAVENOUS | Status: AC
  Filled 2017-12-29: qty 1

## 2017-12-29 MED ORDER — ONDANSETRON HCL 4 MG/2ML IJ SOLN
4.0000 mg | Freq: Once | INTRAMUSCULAR | Status: AC
  Administered 2017-12-29: 4 mg via INTRAVENOUS
  Filled 2017-12-29: qty 2

## 2017-12-29 MED ORDER — IBUPROFEN 200 MG PO TABS: 600.0000 mg | ORAL_TABLET | Freq: Three times a day (TID) | ORAL | 0 refills | Status: DC | PRN

## 2017-12-29 MED ORDER — ONDANSETRON 4 MG PO TBDP: 4.0000 mg | ORAL_TABLET | Freq: Three times a day (TID) | ORAL | 0 refills | Status: DC | PRN

## 2017-12-29 MED ORDER — OXYCODONE-ACETAMINOPHEN 5-325 MG PO TABS
1.0000 | ORAL_TABLET | Freq: Once | ORAL | Status: AC
  Administered 2017-12-29: 1 via ORAL
  Filled 2017-12-29: qty 1

## 2017-12-29 MED ORDER — BACLOFEN 10 MG PO TABS: 10.0000 mg | ORAL_TABLET | Freq: Three times a day (TID) | ORAL | 0 refills | Status: DC | PRN

## 2017-12-29 MED ORDER — MORPHINE SULFATE (PF) 2 MG/ML IV SOLN
2.0000 mg | Freq: Once | INTRAVENOUS | Status: AC
  Administered 2017-12-29: 2 mg via INTRAVENOUS
  Filled 2017-12-29: qty 1

## 2017-12-29 MED ORDER — OXYCODONE-ACETAMINOPHEN 5-325 MG PO TABS: 1.0000 | ORAL_TABLET | ORAL | 0 refills | Status: DC | PRN

## 2017-12-29 MED ORDER — HYDROMORPHONE HCL 1 MG/ML IJ SOLN
1.0000 mg | Freq: Once | INTRAMUSCULAR | Status: AC
  Administered 2017-12-29: 1 mg via INTRAVENOUS

## 2017-12-29 MED ORDER — HYDROMORPHONE HCL 1 MG/ML IJ SOLN
INTRAMUSCULAR | Status: AC
  Administered 2017-12-29: 1 mg via INTRAVENOUS
  Filled 2017-12-29: qty 1

## 2017-12-29 MED ORDER — SODIUM CHLORIDE 0.9 % IV BOLUS
1000.0000 mL | Freq: Once | INTRAVENOUS | Status: AC
  Administered 2017-12-29: 1000 mL via INTRAVENOUS

## 2017-12-29 MED ORDER — MORPHINE SULFATE (PF) 4 MG/ML IV SOLN: 4.0000 mg | Freq: Once | INTRAVENOUS | Status: DC

## 2017-12-29 NOTE — Telephone Encounter (Signed)
Pt needs referral to surgeon for gall bladder [phone]

## 2017-12-29 NOTE — Telephone Encounter (Signed)
Call placed to pt.  Last took med at 9:20 am and has had no pain relief.   - Took one at hospital with mild relief, but was after Dilaudid dose.  START ibuprofen 600 mg tid prn pain as this has higher evidence for pain relief in acute biliary colic.  START baclofen 10 mg tid for possible relief from spasticity.  Do not take with opioid.  Pt verbalizes understanding.  Reviewed s/sx of gall bladder emergency.  If needing additional care over weekend, should seek care in Urgent Care or ER again.    Pt also prefers alternative surgeon if possible.  Less important to have sooner appointment after discussion.  [facility]

## 2017-12-29 NOTE — ED Triage Notes (Addendum)
Pt presents to ED with sudden onset of mid abd pain since around midnight. Vomiting X2. Diarrhea X1 earlier today. Pain in RUQ with palpation. OTC medications taken with no relief.

## 2017-12-29 NOTE — ED Provider Notes (Signed)
[facility] Emergency Department Provider Note    First MD Initiated Contact with Patient 12/29/17 [phone]     (approximate)  I have reviewed the triage vital signs and the nursing notes.   HISTORY  Chief Complaint Abdominal Pain    HPI Michele Hodge is a 32 y.o. female no past medical history presents to the emergency department acute onset of right upper quadrant abdominal pain associate with vomiting which began at midnight tonight.  Patient states current pain score 10 out of 10.  Patient denies any fever.  Denies any urinary symptoms   Past Medical History:  Diagnosis Date  . Healthy adult on routine physical examination     Patient Active Problem List   Diagnosis Date Noted  . Gastroesophageal reflux disease 04/17/2017  . BMI 30.0-30.9,adult 12/26/2016    History reviewed. No pertinent surgical history.  Prior to Admission medications   Medication Sig Start Date End Date Taking? Authorizing Provider  Norethindrone-Ethinyl Estradiol-Fe Biphas (LO LOESTRIN FE) 1 MG-10 MCG / 10 MCG tablet Take 1 tablet by mouth daily. 04/17/17  Yes Galen Manila, NP  ranitidine (ZANTAC) 75 MG tablet Take 1 tablet (75 mg total) by mouth 2 (two) times daily. 04/17/17  Yes Galen Manila, NP  ondansetron (ZOFRAN ODT) 4 MG disintegrating tablet Take 1 tablet (4 mg total) by mouth every 8 (eight) hours as needed for nausea or vomiting. 12/29/17   Darci Current, MD  oxyCODONE-acetaminophen (PERCOCET) 5-325 MG tablet Take 1 tablet by mouth every 4 (four) hours as needed for severe pain. 12/29/17 12/29/18  Darci Current, MD    Allergies No known drug allergies  Family History  Problem Relation Age of Onset  . Diabetes Father 22  . Stroke Maternal Grandmother   . CAD Maternal Grandfather   . Stroke Paternal Grandmother   . Schizophrenia Brother 20  . Breast cancer Neg Hx   . Colon cancer Neg Hx   . Ovarian cancer Neg Hx     Social  History Social History   Tobacco Use  . Smoking status: Never Smoker  . Smokeless tobacco: Current User  Substance Use Topics  . Alcohol use: Yes    Alcohol/week: 0.6 oz    Types: 1 Glasses of wine per week    Comment: occasional use  . Drug use: No    Review of Systems Constitutional: No fever/chills Eyes: No visual changes. ENT: No sore throat. Cardiovascular: Denies chest pain. Respiratory: Denies shortness of breath. Gastrointestinal: Positive for abdominal pain and vomiting Genitourinary: Negative for dysuria. Musculoskeletal: Negative for neck pain.  Negative for back pain. Integumentary: Negative for rash. Neurological: Negative for headaches, focal weakness or numbness.   ____________________________________________   PHYSICAL EXAM:  VITAL SIGNS: ED Triage Vitals  Enc Vitals Group     BP 12/29/17 0306 (!) 136/101     Pulse Rate 12/29/17 0306 91     Resp 12/29/17 0306 (!) 22     Temp 12/29/17 0306 98.3 F (36.8 C)     Temp Source 12/29/17 0306 Oral     SpO2 12/29/17 0306 100 %     Weight 12/29/17 0309 96.2 kg (212 lb)     Height 12/29/17 0309 1.803 m (5\' 11" )     Head Circumference --      Peak Flow --      Pain Score 12/29/17 0307 10     Pain Loc --      Pain Edu? --  Excl. in GC? --     Constitutional: Alert and oriented. Well appearing and in no acute distress. Eyes: Conjunctivae are normal.  Head: Atraumatic. Mouth/Throat: Mucous membranes are moist.  Oropharynx non-erythematous. Neck: No stridor.   Cardiovascular: Normal rate, regular rhythm. Good peripheral circulation. Grossly normal heart sounds. Respiratory: Normal respiratory effort.  No retractions. Lungs CTAB. Gastrointestinal: Right upper quadrant tenderness palpation. No distention.  Musculoskeletal: No lower extremity tenderness nor edema. No gross deformities of extremities. Neurologic:  Normal speech and language. No gross focal neurologic deficits are appreciated.  Skin:  Skin  is warm, dry and intact. No rash noted. Psychiatric: Mood and affect are normal. Speech and behavior are normal.  ____________________________________________   LABS (all labs ordered are listed, but only abnormal results are displayed)  Labs Reviewed  CBC WITH DIFFERENTIAL/PLATELET - Abnormal; Notable for the following components:      Result Value   WBC 12.9 (*)    Neutro Abs 10.3 (*)    All other components within normal limits  COMPREHENSIVE METABOLIC PANEL - Abnormal; Notable for the following components:   Glucose, Bld 127 (*)    All other components within normal limits  LIPASE, BLOOD     RADIOLOGY I,  [name], personally viewed and evaluated these images (plain radiographs) as part of my medical decision making, as well as reviewing the written report by the radiologist.  ED MD interpretation: Gallstone without any evidence of acute cholecystitis per the radiologist.  Official radiology report(s): US Abdomen Limited Ruq  Result Date: 12/29/2017 CLINICAL DATA:  Right upper quadrant pain and vomiting for 4 hours. EXAM: ULTRASOUND ABDOMEN LIMITED RIGHT UPPER QUADRANT COMPARISON:  None. FINDINGS: Gallbladder: Physiologically distended containing gallstones and sludge. No gallbladder wall thickening or pericholecystic fluid. No sonographic Murphy sign noted by sonographer. Common bile duct: Diameter: 3 mm, normal. Liver: No focal lesion identified. Within normal limits in parenchymal echogenicity. Portal vein is patent on color Doppler imaging with normal direction of blood flow towards the liver. IMPRESSION: Gallstones and sludge without sonographic findings of acute cholecystitis. No biliary dilatation. Electronically Signed   By: Rubye Oaks M.D.   On: 12/29/2017 05:08     Procedures   ____________________________________________   INITIAL IMPRESSION / ASSESSMENT AND PLAN / ED COURSE  As part of my medical decision making, I reviewed the following data  within the electronic MEDICAL RECORD NUMBER   32 year old female presenting to the emergency department above-stated history and physical exam secondary to right upper quadrant abdominal pain with vomiting.  Concern for possible cholelithiasis versus cholecystitis and as such ultrasound was performed.  Ultrasound revealed evidence of cholelithiasis with no evidence of cholecystitis.  Laboratory data likewise unremarkable adding liver enzymes and bilirubin.  Patient received multiple doses of IV morphine without any improvement of pain and as such IV Dilaudid was given with resolution of pain.  Spoke with patient at length regarding cholelithiasis and management thereof.  Patient will be referred to general surgery for further outpatient evaluation and management.  Patient advised of warning signs that would warrant immediate return to the emergency department.    ____________________________________________  FINAL CLINICAL IMPRESSION(S) / ED DIAGNOSES  Final diagnoses:  Vomiting  RUQ pain  Gallstones     MEDICATIONS GIVEN DURING THIS VISIT:  Medications  morphine 2 MG/ML injection 2 mg (2 mg Intravenous Given 12/29/17 0314)  ondansetron (ZOFRAN) injection 4 mg (4 mg Intravenous Given 12/29/17 0314)  sodium chloride 0.9 % bolus 1,000 mL (1,000 mLs Intravenous New  Bag/Given 12/29/17 0319)  morphine 4 MG/ML injection 4 mg (4 mg Intravenous Given 12/29/17 0330)  HYDROmorphone (DILAUDID) injection 1 mg (1 mg Intravenous Given 12/29/17 0400)  oxyCODONE-acetaminophen (PERCOCET/ROXICET) 5-325 MG per tablet 1 tablet (1 tablet Oral Given 12/29/17 0525)     ED Discharge Orders        Ordered    oxyCODONE-acetaminophen (PERCOCET) 5-325 MG tablet  Every 4 hours PRN     12/29/17 0548    ondansetron (ZOFRAN ODT) 4 MG disintegrating tablet  Every 8 hours PRN     12/29/17 0548       Note:  This document was prepared using Dragon voice recognition software and may include unintentional dictation errors.     Darci CurrentBrown, [place] N, MD 12/29/17 [phone]

## 2018-01-02 ENCOUNTER — Encounter: Payer: BC Managed Care – PPO | Admitting: Nurse Practitioner

## 2018-01-04 ENCOUNTER — Ambulatory Visit: Payer: Self-pay | Admitting: General Surgery

## 2018-01-08 ENCOUNTER — Encounter: Payer: Self-pay | Admitting: Nurse Practitioner

## 2018-01-15 ENCOUNTER — Encounter: Payer: BC Managed Care – PPO | Admitting: Nurse Practitioner

## 2018-01-19 ENCOUNTER — Other Ambulatory Visit: Payer: Self-pay | Admitting: Nurse Practitioner

## 2018-01-19 DIAGNOSIS — Z30011 Encounter for initial prescription of contraceptive pills: Secondary | ICD-10-CM

## 2018-03-03 ENCOUNTER — Encounter: Payer: Self-pay | Admitting: Nurse Practitioner

## 2018-03-05 ENCOUNTER — Encounter: Payer: BC Managed Care – PPO | Admitting: Nurse Practitioner

## 2018-03-12 ENCOUNTER — Encounter: Payer: Self-pay | Admitting: Nurse Practitioner

## 2018-03-15 ENCOUNTER — Other Ambulatory Visit: Payer: Self-pay

## 2018-03-15 ENCOUNTER — Encounter: Payer: Self-pay | Admitting: Nurse Practitioner

## 2018-03-15 ENCOUNTER — Ambulatory Visit: Payer: BC Managed Care – PPO | Admitting: Nurse Practitioner

## 2018-03-15 VITALS — BP 123/90 | HR 81 | Temp 98.3°F | Ht 71.0 in | Wt 204.4 lb

## 2018-03-15 DIAGNOSIS — Z09 Encounter for follow-up examination after completed treatment for conditions other than malignant neoplasm: Secondary | ICD-10-CM

## 2018-03-15 DIAGNOSIS — K219 Gastro-esophageal reflux disease without esophagitis: Secondary | ICD-10-CM

## 2018-03-15 DIAGNOSIS — Z9049 Acquired absence of other specified parts of digestive tract: Secondary | ICD-10-CM | POA: Diagnosis not present

## 2018-03-15 NOTE — Patient Instructions (Addendum)
Michele Hodge,   Thank you for coming in to clinic today.  1. Walk for 5-10 minutes 2-3 times per day.  Increase to 20-30 minutes once per day for one longer walk.  Sit upright in a straight chair for up to 2 hours at a time.   2. For pain management Start taking Tylenol extra strength 1 to 2 tablets every 6-8 hours for aches or fever/chills for next few days as needed.  Do not take more than 3,000 mg in 24 hours from all medicines.  May take Ibuprofen as well if tolerated 200-400mg  every 8 hours as needed. May alternate tylenol and ibuprofen in same day.  - Use heat and ice.  Apply this for 15 minutes at a time 6-8 times per day.    Please schedule a follow-up appointment with Wilhelmina McardleLauren Margrete Delude, AGNP. Return in about 2 months (around 05/15/2018) for annual physical.  If you have any other questions or concerns, please feel free to call the clinic or send a message through MyChart. You may also schedule an earlier appointment if necessary.  You will receive a survey after today's visit either digitally by e-mail or paper by [employer] mail. Your experiences and feedback matter to us.  Please respond so we know how we are doing as we provide care for you.   Wilhelmina McardleLauren Arnella Pralle, DNP, AGNP-BC Adult Gerontology Nurse Practitioner [facility]   Low-Fat Diet for Pancreatitis or Gallbladder Conditions A low-fat diet can be helpful if you have pancreatitis or a gallbladder condition. With these conditions, your pancreas and gallbladder have trouble digesting fats. A healthy eating plan with less fat will help rest your pancreas and gallbladder and reduce your symptoms. What do I need to know about this diet?  Eat a low-fat diet. ? Reduce your fat intake to less than 20-30% of your total daily calories. This is less than 50-60 g of fat per day. ? Remember that you need some fat in your diet. Ask your dietician what your daily goal should be. ? Choose nonfat and low-fat healthy foods.  Look for the words "nonfat," "low fat," or "fat free." ? As a guide, look on the label and choose foods with less than 3 g of fat per serving. Eat only one serving.  Avoid alcohol.  Do not smoke. If you need help quitting, talk with your health care provider.  Eat small frequent meals instead of three large heavy meals. What foods can I eat? Grains Include healthy grains and starches such as potatoes, wheat bread, fiber-rich cereal, and brown rice. Choose whole grain options whenever possible. In adults, whole grains should account for 45-65% of your daily calories. Fruits and Vegetables Eat plenty of fruits and vegetables. Fresh fruits and vegetables add fiber to your diet. Meats and Other Protein Sources Eat lean meat such as chicken and pork. Trim any fat off of meat before cooking it. Eggs, fish, and beans are other sources of protein. In adults, these foods should account for 10-35% of your daily calories. Dairy Choose low-fat milk and dairy options. Dairy includes fat and protein, as well as calcium. Fats and Oils Limit high-fat foods such as fried foods, sweets, baked goods, sugary drinks. Other Creamy sauces and condiments, such as mayonnaise, can add extra fat. Think about whether or not you need to use them, or use smaller amounts or low fat options. What foods are not recommended?  High fat foods, such as: ? [employer]aked goods. ? Ice cream. ?  [place] toast. ? Sweet rolls. ? Pizza. ? Cheese bread. ? Foods covered with batter, butter, creamy sauces, or cheese. ? Fried foods. ? Sugary drinks and desserts.  Foods that cause gas or bloating This information is not intended to replace advice given to you by your health care provider. Make sure you discuss any questions you have with your health care provider. Document Released: 07/09/2013 Document Revised: 12/10/2015 Document Reviewed: 06/17/2013 Elsevier Interactive Patient Education  2017 [employer].

## 2018-03-15 NOTE — Progress Notes (Signed)
Subjective:    Patient ID: Michele Hodge, female    DOB: [birth year], 32 y.o.   MRN: [id number]  Michele Hodge is a 32 y.o. female presenting on 03/15/2018 for No chief complaint on file.   HPI Hospital followup Hospital/Location: [facility] Date of Admission: 03/02/2018 Date of Discharge: 03/02/2018 Transitions of care telephone call: none  Reason for Admission: Acute cholecystitis Primary (+Secondary) Diagnosis: Calculus of gallbladder with acute cholecystitis without obstruction  FOLLOW-UP - Hospital H&P and Discharge Summary have been reviewed.  Patient had calculus of gallbladder with acute cholecystitis without obstruction and is now s/p cholecystectomy by Dr. Christell Constant. - Patient presents today 13 days after recent hospitalization. Brief summary of recent course, patient had symptoms of sudden onset upper abdominal pain since 8/15 evening at 1900 which was similar to prior events in 12/2017.  Patient was hospitalized for emergent cholecystectomy,  treated with antibiotics, and discharged on same day.  - Today reports overall has done well after discharge. Symptoms of sharp abdominal pain have resolved.  She still has incision site pain when bending forward/standing upright quickly.  Dermabond on 2 of 4 incisions has come off today. - New medications on discharge: Acetaminophen 1,000 mg po q8hr x 10 days, Colace 100 mg po at hs x 14 days, ibuprofen 600 mg q4hr x 10 days, oxycodone 5 mg 1-2 tab po q4hr prn moderate to severe pain x 5 days - Changes to current meds on discharge: none - Stopped taking oxycodone, colace.  Still taking occasional tylenol and ibuprofen. Having normal BM at this time. - Patient with questions about diet to eat after surgery.  Social History   Tobacco Use  . Smoking status: Never Smoker  . Smokeless tobacco: Current User  Substance Use Topics  . Alcohol use: Yes    Alcohol/week: 1.0 standard drinks    Types: 1 Glasses of wine per week    Comment:  occasional use  . Drug use: No    Review of Systems Per HPI unless specifically indicated above     Objective:    There were no vitals taken for this visit.  Wt Readings from Last 3 Encounters:  12/29/17 212 lb (96.2 kg)  08/29/17 224 lb 6.4 oz (101.8 kg)  04/17/17 212 lb (96.2 kg)    Physical Exam  Constitutional: She is oriented to person, place, and time. She appears well-developed and well-nourished. No distress.  HENT:  Head: Normocephalic and atraumatic.  Cardiovascular: Normal rate, regular rhythm, S1 normal, S2 normal, normal heart sounds and intact distal pulses.  Pulmonary/Chest: Effort normal and breath sounds normal. No respiratory distress.  Abdominal: Soft. Normal appearance and bowel sounds are normal. There is no hepatosplenomegaly. There is tenderness (superficial incision site tenderness only.  No deeper abdominal tenderness). There is no rigidity, no rebound, no guarding, no CVA tenderness, no tenderness at McBurney's point and negative Murphy's sign. No hernia.    Neurological: She is alert and oriented to person, place, and time.  Skin: Skin is warm and dry. Capillary refill takes less than 2 seconds.  Psychiatric: She has a normal mood and affect. Her behavior is normal. Judgment and thought content normal.  Vitals reviewed.     Assessment & Plan:   Problem List Items Addressed This Visit      Digestive   Gastroesophageal reflux disease    Resolved off medications.  Continue with intermittent antacids OCT prn.  Followup as needed.        Other   S/P laparoscopic  cholecystectomy    [facility]. She is now 13 days post-op and is preparing to return to work on 03/20/2018.  Normal appetite, oral intake, BM without constipation or diarrhea.  Plan: 1. Continue acetaminophen/ibuprofen OTC for pain prn. Provided max dosing instructions. 2. Recommend using heat/ice prn for pain not relieved by  OTC meds. 3. STOP all oxycodone. 4. Slowly increase physical activity with regular walks, sitting upright throughout the day for up to 2 hours in preparation for return to work. 5. Followup with surgeon prn.       Other Visit Diagnoses    Hospital discharge follow-up    -  Primary   See plan cholecystectomy       Follow up plan: Return in about 2 months (around 05/15/2018) for annual physical.  Wilhelmina McardleLauren Khalfani Weideman, DNP, AGPCNP-BC Adult Gerontology Primary Care Nurse Practitioner [facility] 03/15/2018, 1:24 PM

## 2018-03-15 NOTE — Assessment & Plan Note (Signed)
[facility]. She is now 13 days post-op and is preparing to return to work on 03/20/2018.  Normal appetite, oral intake, BM without constipation or diarrhea.  Plan: 1. Continue acetaminophen/ibuprofen OTC for pain prn. Provided max dosing instructions. 2. Recommend using heat/ice prn for pain not relieved by OTC meds. 3. STOP all oxycodone. 4. Slowly increase physical activity with regular walks, sitting upright throughout the day for up to 2 hours in preparation for return to work. 5. Followup with surgeon prn.

## 2018-03-15 NOTE — Assessment & Plan Note (Signed)
Resolved off medications.  Continue with intermittent antacids OCT prn.  Followup as needed.

## 2018-12-19 ENCOUNTER — Other Ambulatory Visit: Payer: Self-pay | Admitting: Nurse Practitioner

## 2018-12-19 DIAGNOSIS — Z30011 Encounter for initial prescription of contraceptive pills: Secondary | ICD-10-CM

## 2019-01-03 ENCOUNTER — Telehealth: Payer: Self-pay

## 2019-01-03 NOTE — Telephone Encounter (Signed)
Covering inbox for Michele Hodge, AGPCNP-BC while she is out of office.  I would say she needs a new virtual visit to document everything and make sure history is updated since 2018.  Will then be able to complete the referral form and fax with her office visit note.  Would send to [facility] unless she has a preference.  Nobie Putnam, [facility] 01/03/2019, 1:04 PM

## 2019-01-03 NOTE — Telephone Encounter (Signed)
Patient called and wanted to have a referral for a sleep study.  She seen Lauren on 10/13/16 and she referred her then.  However, patient never followed though.  Would patient need a phone visit to get new referral or can you submit a new one?

## 2019-01-04 NOTE — Telephone Encounter (Signed)
LM for patient to call back and make phone visit.

## 2019-01-20 IMAGING — US US ABDOMEN LIMITED
1 series · 14 of 25 positions shown · non-contrast
Comparison: None.

CLINICAL DATA: Right upper quadrant pain and vomiting for 4 hours.

EXAM:
ULTRASOUND ABDOMEN LIMITED RIGHT UPPER QUADRANT

[Series 1: us abdomen limited · 0.28mm/px · 14 of 37 slices shown]
[im 1/37]
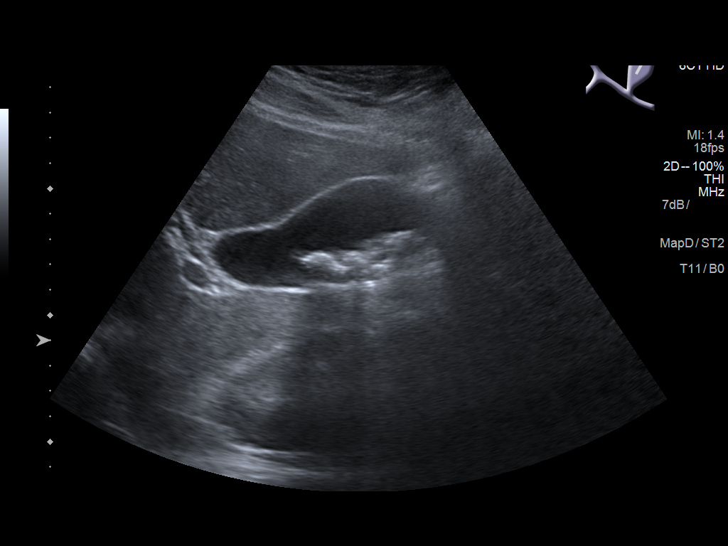
[im 4/37]
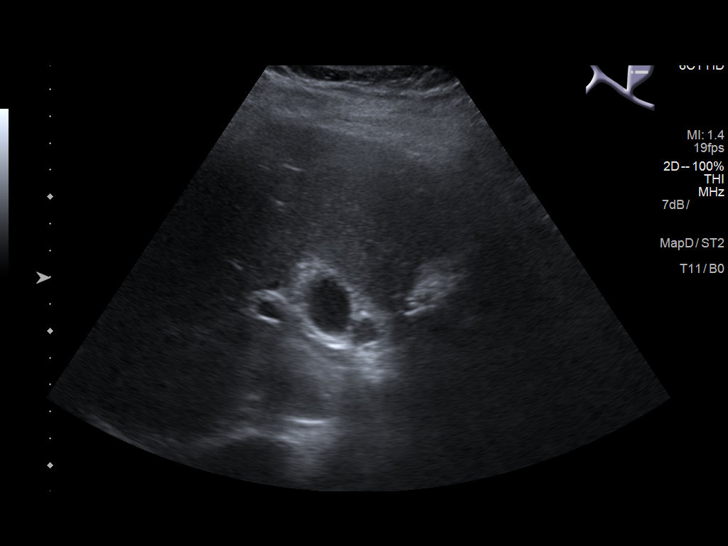
[im 7/37]
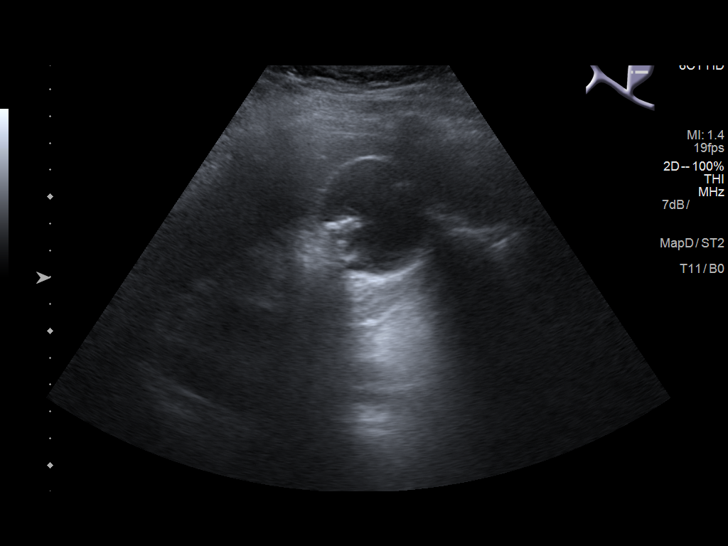
[im 10/37]
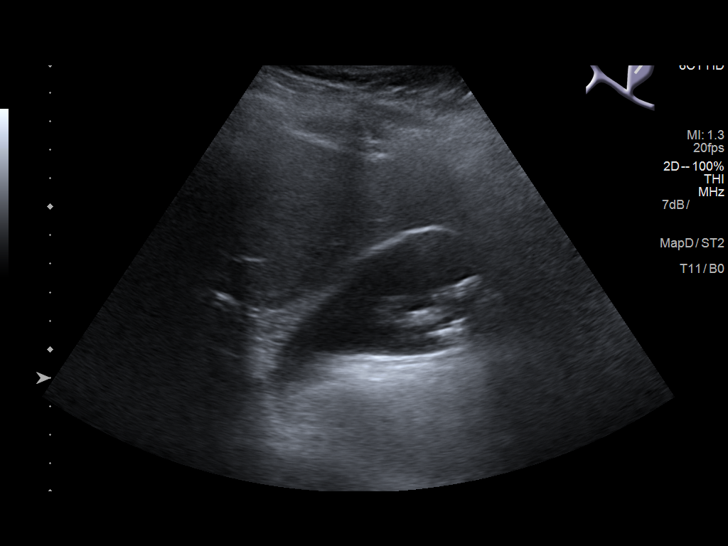
[im 13/37]
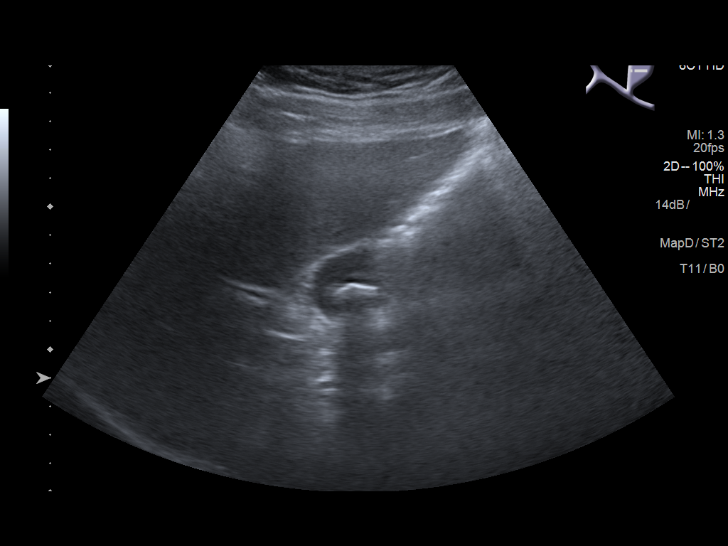
[im 14/37]
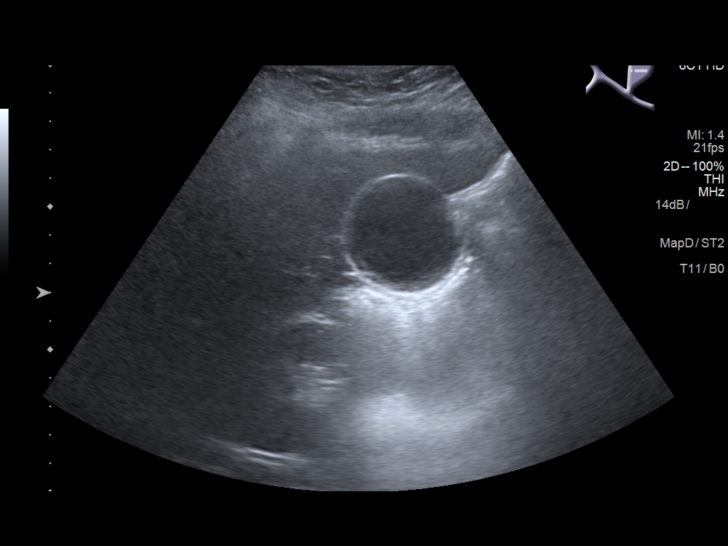
[im 17/37]
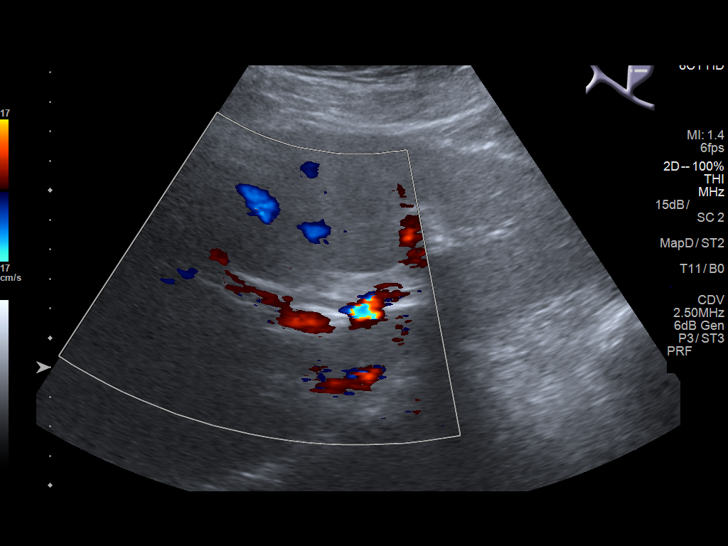
[im 20/37]
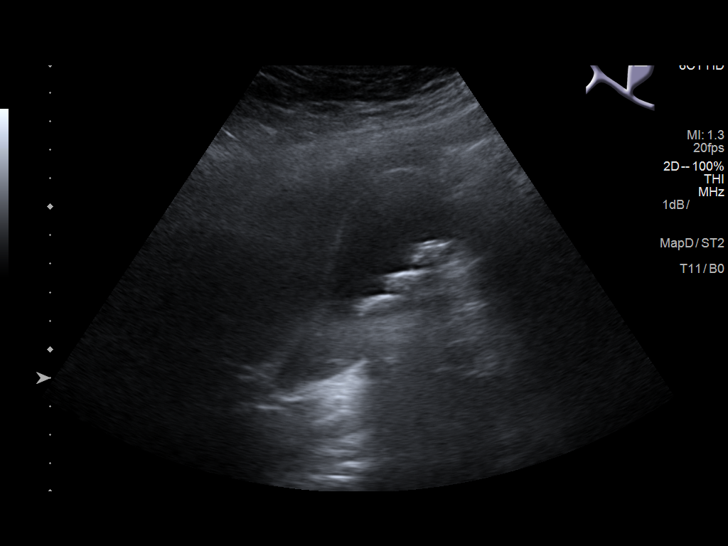
[im 23/37]
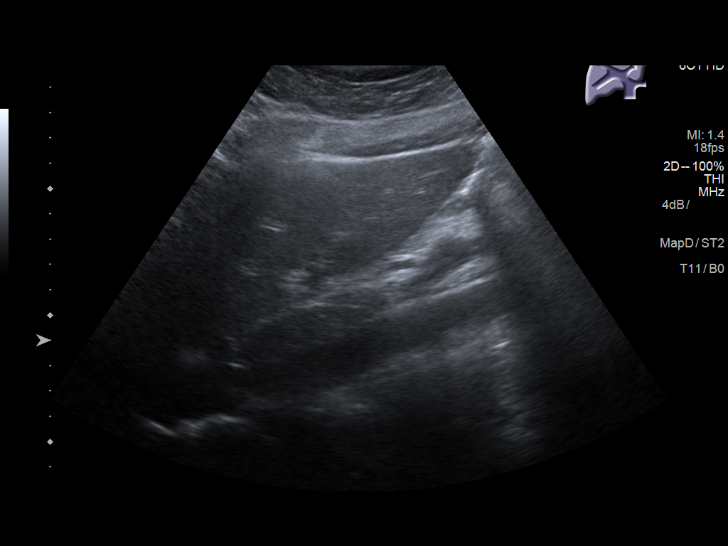
[im 25/37]
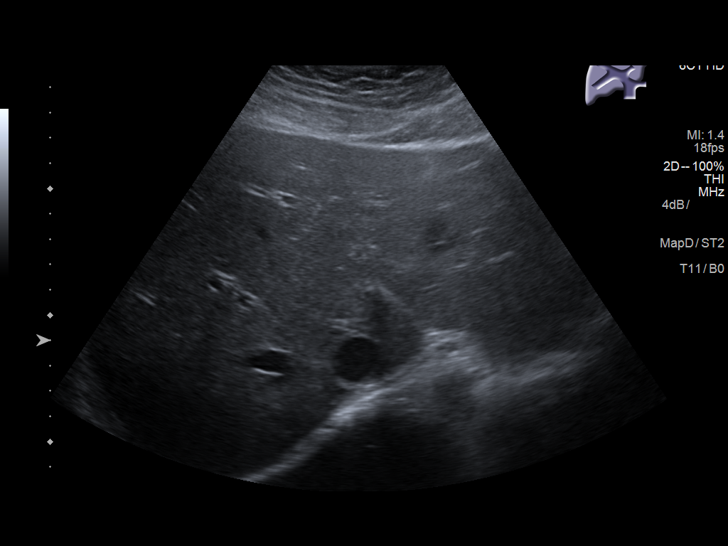
[im 28/37]
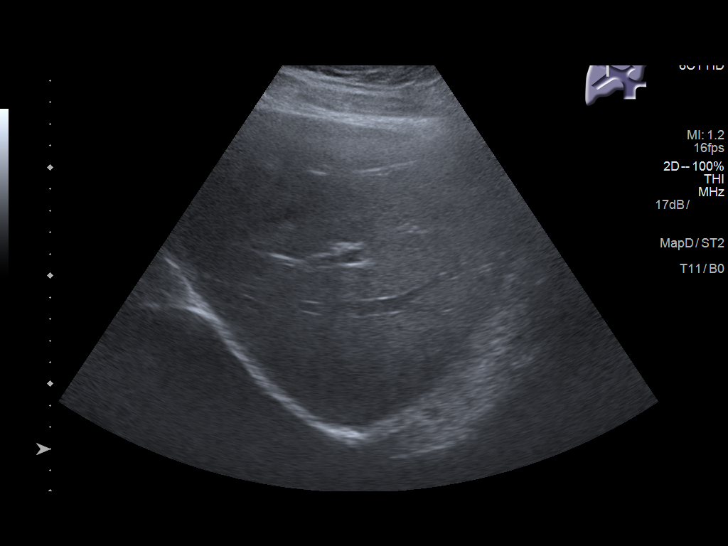
[im 31/37]
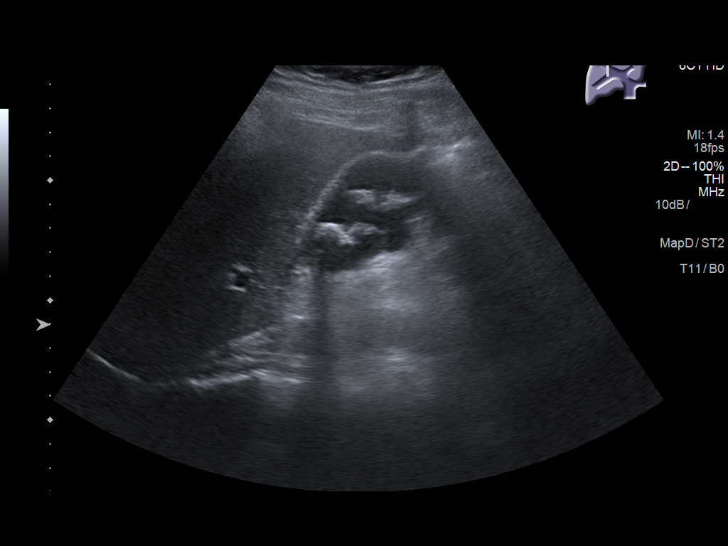
[im 34/37]
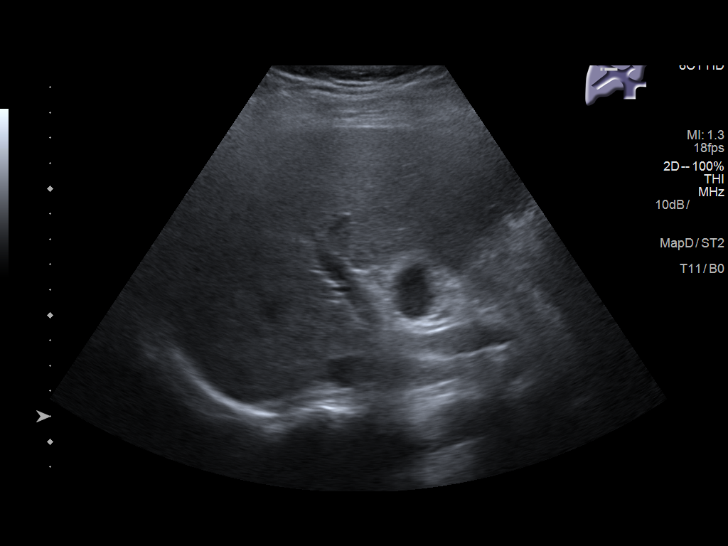
[im 37/37]
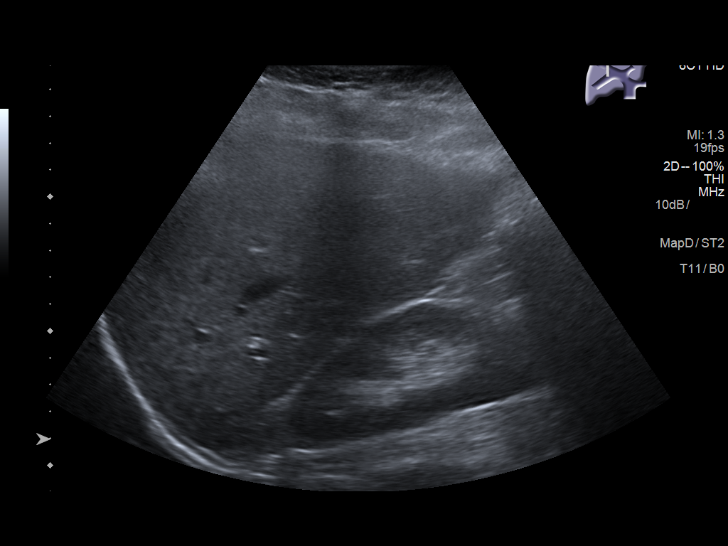

[14 of 25 positions shown; findings below may reference images not displayed]

FINDINGS: Gallbladder:

Physiologically distended containing gallstones and sludge. No
gallbladder wall thickening or pericholecystic fluid. No sonographic
Murphy sign noted by sonographer.

Common bile duct:

Diameter: 3 mm, normal.

Liver:

No focal lesion identified. Within normal limits in parenchymal
echogenicity. Portal vein is patent on color Doppler imaging with
normal direction of blood flow towards the liver.
IMPRESSION: Gallstones and sludge without sonographic findings of acute
cholecystitis. No biliary dilatation.

## 2019-01-29 ENCOUNTER — Encounter: Payer: Self-pay | Admitting: Nurse Practitioner

## 2019-02-05 ENCOUNTER — Ambulatory Visit (INDEPENDENT_AMBULATORY_CARE_PROVIDER_SITE_OTHER): Payer: BC Managed Care – PPO | Admitting: Nurse Practitioner

## 2019-02-05 ENCOUNTER — Other Ambulatory Visit: Payer: Self-pay

## 2019-02-05 ENCOUNTER — Encounter: Payer: Self-pay | Admitting: Nurse Practitioner

## 2019-02-05 ENCOUNTER — Other Ambulatory Visit (HOSPITAL_COMMUNITY)
Admission: RE | Admit: 2019-02-05 | Discharge: 2019-02-05 | Disposition: A | Discharge status: Home / Self Care | Payer: BC Managed Care – PPO | Source: Ambulatory Visit | Attending: Nurse Practitioner | Admitting: Nurse Practitioner

## 2019-02-05 VITALS — BP 99/66 | HR 69 | Ht 71.0 in | Wt 227.8 lb

## 2019-02-05 DIAGNOSIS — Z0000 Encounter for general adult medical examination without abnormal findings: Secondary | ICD-10-CM | POA: Diagnosis not present

## 2019-02-05 DIAGNOSIS — Z0001 Encounter for general adult medical examination with abnormal findings: Secondary | ICD-10-CM

## 2019-02-05 DIAGNOSIS — Z124 Encounter for screening for malignant neoplasm of cervix: Secondary | ICD-10-CM

## 2019-02-05 DIAGNOSIS — Z23 Encounter for immunization: Secondary | ICD-10-CM | POA: Diagnosis not present

## 2019-02-05 DIAGNOSIS — Z30016 Encounter for initial prescription of transdermal patch hormonal contraceptive device: Secondary | ICD-10-CM

## 2019-02-05 DIAGNOSIS — Z113 Encounter for screening for infections with a predominantly sexual mode of transmission: Secondary | ICD-10-CM | POA: Insufficient documentation

## 2019-02-05 DIAGNOSIS — B373 Candidiasis of vulva and vagina: Secondary | ICD-10-CM

## 2019-02-05 DIAGNOSIS — B3731 Acute candidiasis of vulva and vagina: Secondary | ICD-10-CM

## 2019-02-05 MED ORDER — NORELGESTROMIN-ETH ESTRADIOL 150-35 MCG/24HR TD PTWK: MEDICATED_PATCH | TRANSDERMAL | 12 refills | Status: DC

## 2019-02-05 MED ORDER — FLUCONAZOLE 150 MG PO TABS: 150.0000 mg | ORAL_TABLET | Freq: Once | ORAL | 0 refills | Status: AC

## 2019-02-05 NOTE — Progress Notes (Signed)
Subjective:    Patient ID: Michele Hodge, female    DOB: Sep 03, 1985, 33 y.o.   MRN: [id number]  Michele Hodge is a 33 y.o. female presenting on 02/05/2019 for Annual Exam  HPI Annual Physical Exam Patient has been feeling well.  They have no acute concerns today. Sleeps 4-5 hours per night interrupted.  HEALTH MAINTENANCE: Weight/BMI: steadily increasing Physical activity: regular light intensity walking Diet: regular Seatbelt: always Sunscreen: regularly PAP: due today HIV: requests 2/2 new sexual partner Optometry: regularly Dentistry: regular  VACCINES: Tetanus: 2011 - due in 5 mos Influenza: 2018  Past Medical History:  Diagnosis Date  . Healthy adult on routine physical examination    No past surgical history on file. Social History   Socioeconomic History  . Marital status: Single    Spouse name: Not on file  . Number of children: Not on file  . Years of education: Not on file  . Highest education level: Not on file  Occupational History  . Not on file  Social Needs  . Financial resource strain: Not on file  . Food insecurity    Worry: Not on file    Inability: Not on file  . Transportation needs    Medical: Not on file    Non-medical: Not on file  Tobacco Use  . Smoking status: Never Smoker  . Smokeless tobacco: Current User  Substance and Sexual Activity  . Alcohol use: Yes    Alcohol/week: 1.0 standard drinks    Types: 1 Glasses of wine per week    Comment: occasional use  . Drug use: Yes    Types: Marijuana  . Sexual activity: Never  Lifestyle  . Physical activity    Days per week: Not on file    Minutes per session: Not on file  . Stress: Not on file  Relationships  . Social Herbalist on phone: Not on file    Gets together: Not on file    Attends religious service: Not on file    Active member of club or organization: Not on file    Attends meetings of clubs or organizations: Not on file    Relationship status: Not on  file  . Intimate partner violence    Fear of current or ex partner: Not on file    Emotionally abused: Not on file    Physically abused: Not on file    Forced sexual activity: Not on file  Other Topics Concern  . Not on file  Social History Narrative  . Not on file   Family History  Problem Relation Age of Onset  . Diabetes Father 5  . Stroke Maternal Grandmother   . CAD Maternal Grandfather   . Stroke Paternal Grandmother   . Schizophrenia Brother 20  . Breast cancer Neg Hx   . Colon cancer Neg Hx   . Ovarian cancer Neg Hx    Current Outpatient Medications on File Prior to Visit  Medication Sig  . acetaminophen (TYLENOL) 500 MG tablet Take 500 mg by mouth at bedtime as needed.  . LO LOESTRIN FE 1 MG-10 MCG / 10 MCG tablet TAKE 1 TABLET BY MOUTH EVERY DAY   No current facility-administered medications on file prior to visit.    Review of Systems Per HPI unless specifically indicated above    Objective:    BP 99/66 (BP Location: Left Arm, Patient Position: Sitting, Cuff Size: Normal)   Pulse 69   Ht 5\' 11"  (1.803 m)  Wt 227 lb 12.8 oz (103.3 kg)   BMI 31.77 kg/m   Wt Readings from Last 3 Encounters:  02/05/19 227 lb 12.8 oz (103.3 kg)  03/15/18 204 lb 6.4 oz (92.7 kg)  12/29/17 212 lb (96.2 kg)    Physical Exam Vitals signs and nursing note reviewed.  Constitutional:      General: She is not in acute distress.    Appearance: She is well-developed. She is obese.  HENT:     Head: Normocephalic and atraumatic.     Right Ear: External ear normal.     Left Ear: External ear normal.     Nose: Nose normal.  Eyes:     Conjunctiva/sclera: Conjunctivae normal.     Pupils: Pupils are equal, round, and reactive to light.  Neck:     Musculoskeletal: Normal range of motion and neck supple.     Thyroid: No thyromegaly.     Vascular: No JVD.     Trachea: No tracheal deviation.  Cardiovascular:     Rate and Rhythm: Normal rate and regular rhythm.     Heart sounds:  Normal heart sounds. No murmur. No friction rub. No gallop.   Pulmonary:     Effort: Pulmonary effort is normal. No respiratory distress.     Breath sounds: Normal breath sounds.  Abdominal:     General: Bowel sounds are normal. There is no distension.     Palpations: Abdomen is soft.     Tenderness: There is no abdominal tenderness.  Genitourinary:    Comments: Normal external female genitalia without lesions or fusion. Vaginal canal without lesions. Normal appearing cervix without lesions or friability. Thick white discharge on exam. Bimanual exam without adnexal masses, enlarged uterus, or cervical motion tenderness.  Breast - Normal exam w/ symmetric breasts, no mass, no nipple discharge, no skin changes or tenderness.  Musculoskeletal: Normal range of motion.  Lymphadenopathy:     Cervical: No cervical adenopathy.  Skin:    General: Skin is warm and dry.  Neurological:     Mental Status: She is alert and oriented to person, place, and time.     Cranial Nerves: No cranial nerve deficit.  Psychiatric:        Behavior: Behavior normal.        Thought Content: Thought content normal.        Judgment: Judgment normal.    Results for orders placed or performed during the hospital encounter of 12/29/17  CBC with Differential  Result Value Ref Range   WBC 12.9 (H) 3.6 - 11.0 K/uL   RBC 4.66 3.80 - 5.20 MIL/uL   Hemoglobin 14.2 12.0 - 16.0 g/dL   HCT 16.141.8 09.635.0 - 04.547.0 %   MCV 89.8 80.0 - 100.0 fL   MCH 30.5 26.0 - 34.0 pg   MCHC 34.0 32.0 - 36.0 g/dL   RDW 40.912.4 81.111.5 - 91.414.5 %   Platelets 304 150 - 440 K/uL   Neutrophils Relative % 80 %   Neutro Abs 10.3 (H) 1.4 - 6.5 K/uL   Lymphocytes Relative 15 %   Lymphs Abs 1.9 1.0 - 3.6 K/uL   Monocytes Relative 4 %   Monocytes Absolute 0.5 0.2 - 0.9 K/uL   Eosinophils Relative 0 %   Eosinophils Absolute 0.0 0 - 0.7 K/uL   Basophils Relative 1 %   Basophils Absolute 0.1 0 - 0.1 K/uL  Comprehensive metabolic panel  Result Value Ref  Range   Sodium 139 135 - 145 mmol/L  Potassium 3.5 3.5 - 5.1 mmol/L   Chloride 105 101 - 111 mmol/L   CO2 24 22 - 32 mmol/L   Glucose, Bld 127 (H) 65 - 99 mg/dL   BUN 12 6 - 20 mg/dL   Creatinine, Ser 4.090.91 0.44 - 1.00 mg/dL   Calcium 9.4 8.9 - 81.110.3 mg/dL   Total Protein 7.8 6.5 - 8.1 g/dL   Albumin 4.5 3.5 - 5.0 g/dL   AST 24 15 - 41 U/L   ALT 28 14 - 54 U/L   Alkaline Phosphatase 41 38 - 126 U/L   Total Bilirubin 0.6 0.3 - 1.2 mg/dL   GFR calc non Af Amer >60 >60 mL/min   GFR calc Af Amer >60 >60 mL/min   Anion gap 10 5 - 15  Lipase, blood  Result Value Ref Range   Lipase 31 11 - 51 U/L      Assessment & Plan:   Problem List Items Addressed This Visit    None    Visit Diagnoses    Encounter for annual physical exam    -  Primary Annual physical exam with new findings of vaginal candidiasis.  Well adult with no acute concerns.  Patient with 4 sexual partners in last 6 months is candidate for STD testing.  Plan: 1. Obtain health maintenance screenings as above according to age. - Increase physical activity to 30 minutes most days of the week.  - Eat healthy diet high in vegetables and fruits; low in refined carbohydrates. - Screening labs and tests as ordered - PAP with HPV cotesting > 33 years of age. 2. Return 1 year for annual physical.    Relevant Orders   CBC with Differential/Platelet   COMPLETE METABOLIC PANEL WITH GFR   Lipid panel   Hemoglobin A1c   HIV Antibody (routine testing w rflx)   RPR   Cervicovaginal ancillary only   Cytology - PAP   Need for diphtheria-tetanus-pertussis (Tdap) vaccine     Administer Tdap today.  Due Jan 2021.   Relevant Orders   Tdap vaccine greater than or equal to 7yo IM (Completed)   Encounter for initial prescription of transdermal patch hormonal contraceptive device     Patient desires IUD.  Is inconsistent with OCP dosing.  Desires to have patch prior to IUD placement for contraception.  Education provided.  Start patch  and take pill day 1 of patch.  Day 2 of patch, stop taking OCP.   Relevant Medications   norelgestromin-ethinyl estradiol (ORTHO EVRA) 150-35 MCG/24HR transdermal patch   Other Relevant Orders   POCT urine pregnancy   Screen for STD (sexually transmitted disease)       Relevant Orders   HIV Antibody (routine testing w rflx)   RPR   Cervicovaginal ancillary only   Cervical cancer screening       Relevant Orders   Cytology - PAP   Vaginal candidiasis     Thick, white discharge on exam with history of vaginal itching over last 2 weeks.  Start diflucan 150 mg one tablet once.  Follow-up prn.   Relevant Medications   fluconazole (DIFLUCAN) 150 MG tablet      Meds ordered this encounter  Medications  . norelgestromin-ethinyl estradiol (ORTHO EVRA) 150-35 MCG/24HR transdermal patch    Sig: Place 1 patch onto the skin and leave in place for 7 days.  Change patch every 7 days for 21 days. Then go without a patch for 7 days.  Repeat every 28 days.  Dispense:  3 patch    Refill:  12    Order Specific Question:   Supervising Provider    Answer:   Smitty CordsKARAMALEGOS, ALEXANDER J [[id number]]  . fluconazole (DIFLUCAN) 150 MG tablet    Sig: Take 1 tablet (150 mg total) by mouth once for 1 dose.    Dispense:  1 tablet    Refill:  0    Order Specific Question:   Supervising Provider    Answer:   Smitty CordsKARAMALEGOS, ALEXANDER J [[id number]]   Follow up plan: Return in about 1 year (around 02/05/2020) for annual physical.  Michele McardleLauren Ashleah Valtierra, DNP, AGPCNP-BC Adult Gerontology Primary Care Nurse Practitioner [facility] 02/05/2019, 2:45 PM

## 2019-02-05 NOTE — Patient Instructions (Addendum)
Michele Hodge,   Thank you for coming in to clinic today.  1. You will be due for FASTING BLOOD WORK.  This means you should eat no food or drink after midnight.  Drink only water or coffee without cream/sugar on the morning of your lab visit. - Please go ahead and schedule a "Lab Only" visit in the morning at the clinic for lab draw in the next 7 days. - Your results will be available about 2-3 days after blood draw.  If you have set up a MyChart account, you can can log in to MyChart online to view your results and a brief explanation. Also, we can discuss your results together at your next office visit if you would like.  2. Continue working toward healthy eating and regular activity.  3. - START OrthoEvra patch.  Place one patch on the skin each week. Change the patch the same weekday it was placed 7 days later.  Place a patch each week for 3 weeks, then have 1 week off.  Repeat every 4 weeks to mimic a normal menstrual cycle. START your patches the same day of your next menstrual period or on the next weekday you would like to remember for your patch changes, since you are currently taking contraception.  Use a backup method for the next 2 weeks.  Please schedule a follow-up appointment with Wilhelmina McardleLauren Adell Koval, AGNP. Return in about 1 year (around 02/05/2020) for annual physical.  If you have any other questions or concerns, please feel free to call the clinic or send a message through MyChart. You may also schedule an earlier appointment if necessary.  You will receive a survey after today's visit either digitally by e-mail or paper by [employer] mail. Your experiences and feedback matter to us.  Please respond so we know how we are doing as we provide care for you.   Wilhelmina McardleLauren Kelissa Merlin, DNP, AGNP-BC Adult Gerontology Nurse Practitioner [facility], [facility]  Sleep Hygiene Tips  Take medicines only as directed by your health care provider.  Keep regular sleeping and waking hours.  Avoid naps.  Keep a sleep diary to help you and your health care provider figure out what could be causing your insomnia. Include:  When you sleep.  When you wake up during the night.  How well you sleep.  How rested you feel the next day.  Any side effects of medicines you are taking.  What you eat and drink.  Make your bedroom a comfortable place where it is easy to fall asleep:  Put up shades or special blackout curtains to block light from outside.  Use a white noise machine to block noise.  Keep the temperature cool.  Exercise regularly as directed by your health care provider. Avoid exercising right before bedtime.  Use relaxation techniques to manage stress. Ask your health care provider to suggest some techniques that may work well for you. These may include:  Breathing exercises.  Routines to release muscle tension.  Visualizing peaceful scenes.  Cut back on alcohol, caffeinated beverages, and cigarettes, especially close to bedtime. These can disrupt your sleep.  Do not overeat or eat spicy foods right before bedtime. This can lead to digestive discomfort that can make it hard for you to sleep.  Limit screen use before bedtime. This includes:  Watching TV.  Using your smartphone, tablet, and computer.  Stick to a routine. This can help you fall asleep faster. Try to do a quiet activity, brush your teeth,  and go to bed at the same time each night.  Get out of bed if you are still awake after 15 minutes of trying to sleep. Keep the lights down, but try reading or doing a quiet activity. When you feel sleepy, go back to bed.  Make sure that you drive carefully. Avoid driving if you feel very sleepy.  Keep all follow-up appointments as directed by your health care provider. This is important.

## 2019-02-06 ENCOUNTER — Other Ambulatory Visit: Payer: BC Managed Care – PPO

## 2019-02-06 LAB — POCT URINE PREGNANCY: Preg Test, Ur: NEGATIVE

## 2019-02-06 NOTE — Addendum Note (Signed)
Addended by: Wilson Singer on: 02/06/2019 11:49 AM   Modules accepted: Orders

## 2019-02-07 LAB — CBC WITH DIFFERENTIAL/PLATELET
Absolute Monocytes: 578 cells/uL (ref 200–950)
Basophils Absolute: 68 cells/uL (ref 0–200)
Basophils Relative: 0.8 %
Eosinophils Absolute: 111 cells/uL (ref 15–500)
Eosinophils Relative: 1.3 %
HCT: 40.6 % (ref 35.0–45.0)
Hemoglobin: 13.5 g/dL (ref 11.7–15.5)
Lymphs Abs: 3434 cells/uL (ref 850–3900)
MCH: 30.8 pg (ref 27.0–33.0)
MCHC: 33.3 g/dL (ref 32.0–36.0)
MCV: 92.7 fL (ref 80.0–100.0)
MPV: 9.7 fL (ref 7.5–12.5)
Monocytes Relative: 6.8 %
Neutro Abs: 4310 cells/uL (ref 1500–7800)
Neutrophils Relative %: 50.7 %
Platelets: 300 10*3/uL (ref 140–400)
RBC: 4.38 10*6/uL (ref 3.80–5.10)
RDW: 11.9 % (ref 11.0–15.0)
Total Lymphocyte: 40.4 %
WBC: 8.5 10*3/uL (ref 3.8–10.8)

## 2019-02-07 LAB — CERVICOVAGINAL ANCILLARY ONLY
Chlamydia: NEGATIVE
Neisseria Gonorrhea: NEGATIVE
Trichomonas: NEGATIVE

## 2019-02-07 LAB — LIPID PANEL
Cholesterol: 190 mg/dL (ref <200)
HDL: 51 mg/dL (ref >=50)
LDL Cholesterol (Calc): 102 mg/dL (calc) — ABNORMAL HIGH
Non-HDL Cholesterol (Calc): 139 mg/dL (calc) — ABNORMAL HIGH (ref <130)
Total CHOL/HDL Ratio: 3.7 (calc) (ref <5.0)
Triglycerides: 258 mg/dL — ABNORMAL HIGH (ref <150)

## 2019-02-07 LAB — COMPLETE METABOLIC PANEL WITH GFR
AG Ratio: 1.8 (calc) (ref 1.0–2.5)
ALT: 32 U/L — ABNORMAL HIGH (ref 6–29)
AST: 28 U/L (ref 10–30)
Albumin: 4.2 g/dL (ref 3.6–5.1)
Alkaline phosphatase (APISO): 34 U/L (ref 31–125)
BUN: 10 mg/dL (ref 7–25)
CO2: 27 mmol/L (ref 20–32)
Calcium: 9.3 mg/dL (ref 8.6–10.2)
Chloride: 106 mmol/L (ref 98–110)
Creat: 0.84 mg/dL (ref 0.50–1.10)
GFR, Est African American: 106 mL/min/{1.73_m2} (ref >=60)
GFR, Est Non African American: 91 mL/min/{1.73_m2} (ref >=60)
Globulin: 2.3 g/dL (calc) (ref 1.9–3.7)
Glucose, Bld: 84 mg/dL (ref 65–99)
Potassium: 4.2 mmol/L (ref 3.5–5.3)
Sodium: 139 mmol/L (ref 135–146)
Total Bilirubin: 0.6 mg/dL (ref 0.2–1.2)
Total Protein: 6.5 g/dL (ref 6.1–8.1)

## 2019-02-07 LAB — HEMOGLOBIN A1C
Hgb A1c MFr Bld: 5.4 % of total Hgb (ref <5.7)
Mean Plasma Glucose: 108 (calc)
eAG (mmol/L): 6 (calc)

## 2019-02-07 LAB — CYTOLOGY - PAP: Diagnosis: NEGATIVE

## 2019-02-07 LAB — RPR: RPR Ser Ql: NONREACTIVE

## 2019-02-07 LAB — HIV ANTIBODY (ROUTINE TESTING W REFLEX): HIV 1&2 Ab, 4th Generation: NONREACTIVE

## 2019-02-25 ENCOUNTER — Other Ambulatory Visit: Payer: Self-pay | Admitting: Family Medicine

## 2019-02-25 ENCOUNTER — Encounter: Payer: Self-pay | Admitting: Nurse Practitioner

## 2019-02-25 DIAGNOSIS — Z30011 Encounter for initial prescription of contraceptive pills: Secondary | ICD-10-CM

## 2019-03-22 ENCOUNTER — Encounter: Payer: Self-pay | Admitting: Nurse Practitioner

## 2019-04-22 ENCOUNTER — Encounter: Payer: Self-pay | Admitting: Nurse Practitioner

## 2019-04-22 DIAGNOSIS — Z111 Encounter for screening for respiratory tuberculosis: Secondary | ICD-10-CM

## 2019-04-25 LAB — QUANTIFERON-TB GOLD PLUS
QuantiFERON Mitogen Value: >10 IU/mL
QuantiFERON Nil Value: 0.03 IU/mL
QuantiFERON TB1 Ag Value: 0.11 IU/mL
QuantiFERON TB2 Ag Value: 0.09 IU/mL
QuantiFERON-TB Gold Plus: NEGATIVE

## 2019-05-09 ENCOUNTER — Encounter: Payer: Self-pay | Admitting: Nurse Practitioner

## 2019-05-09 ENCOUNTER — Other Ambulatory Visit: Payer: Self-pay

## 2019-05-09 DIAGNOSIS — Z20822 Contact with and (suspected) exposure to covid-19: Secondary | ICD-10-CM

## 2019-05-11 LAB — NOVEL CORONAVIRUS, NAA: SARS-CoV-2, NAA: NOT DETECTED

## 2019-05-17 ENCOUNTER — Encounter: Payer: Self-pay | Admitting: Nurse Practitioner

## 2019-05-18 ENCOUNTER — Encounter: Payer: Self-pay | Admitting: Nurse Practitioner

## 2019-08-07 ENCOUNTER — Ambulatory Visit: Payer: BC Managed Care – PPO | Attending: Internal Medicine

## 2019-08-07 ENCOUNTER — Other Ambulatory Visit: Payer: BC Managed Care – PPO

## 2019-08-07 DIAGNOSIS — Z20822 Contact with and (suspected) exposure to covid-19: Secondary | ICD-10-CM

## 2019-08-08 LAB — NOVEL CORONAVIRUS, NAA: SARS-CoV-2, NAA: DETECTED — AB

## 2020-12-17 ENCOUNTER — Ambulatory Visit: Payer: BC Managed Care – PPO | Admitting: Internal Medicine

## 2020-12-19 ENCOUNTER — Encounter (HOSPITAL_COMMUNITY): Payer: Self-pay

## 2020-12-19 ENCOUNTER — Ambulatory Visit (HOSPITAL_COMMUNITY)
Admission: EM | Admit: 2020-12-19 | Discharge: 2020-12-19 | Disposition: A | Discharge status: Home / Self Care | Payer: BC Managed Care – PPO | Attending: Physician Assistant | Admitting: Physician Assistant

## 2020-12-19 DIAGNOSIS — Z79899 Other long term (current) drug therapy: Secondary | ICD-10-CM | POA: Diagnosis not present

## 2020-12-19 DIAGNOSIS — Z20822 Contact with and (suspected) exposure to covid-19: Secondary | ICD-10-CM | POA: Diagnosis not present

## 2020-12-19 DIAGNOSIS — R509 Fever, unspecified: Secondary | ICD-10-CM | POA: Insufficient documentation

## 2020-12-19 DIAGNOSIS — J029 Acute pharyngitis, unspecified: Secondary | ICD-10-CM | POA: Diagnosis not present

## 2020-12-19 DIAGNOSIS — J039 Acute tonsillitis, unspecified: Secondary | ICD-10-CM | POA: Diagnosis not present

## 2020-12-19 LAB — POC INFLUENZA A AND B ANTIGEN (URGENT CARE ONLY)
INFLUENZA A ANTIGEN, POC: NEGATIVE
INFLUENZA B ANTIGEN, POC: NEGATIVE

## 2020-12-19 LAB — POCT RAPID STREP A, ED / UC: Streptococcus, Group A Screen (Direct): NEGATIVE

## 2020-12-19 MED ORDER — AMOXICILLIN-POT CLAVULANATE 875-125 MG PO TABS: 1.0000 | ORAL_TABLET | Freq: Two times a day (BID) | ORAL | 0 refills | Status: DC

## 2020-12-19 MED ORDER — LIDOCAINE VISCOUS HCL 2 % MT SOLN: 15.0000 mL | OROMUCOSAL | 0 refills | Status: DC | PRN

## 2020-12-19 NOTE — Discharge Instructions (Addendum)
Your strep test and flu test were negative.  As we discussed, I am concerned for throat infection but we were unable to rule out that this was mononucleosis.  We are going to call in antibiotics and if your symptoms persist another 24 hours or if at any point they worsen please start this.  If you do have mono and you take this medication you will develop a rash please stop it immediately and be seen.  I have called in viscous lidocaine to help with your symptoms.  Please do not use this directly before eating or drinking as it increases the risk of choking.  We will be in touch with your other results once we have them.  Please follow-up with your primary care doctor as we discussed on Wednesday.  If you have any worsening symptoms including fever not responding to medication, nausea/vomiting preventing you from eating and drinking, muffled voice, inability to swallow you need to go to the emergency room.

## 2020-12-19 NOTE — ED Triage Notes (Signed)
Pt present fever with sore throat, symptoms started two days ago. Pt states that she is having difficulty swallowing.

## 2020-12-19 NOTE — ED Provider Notes (Signed)
[facility]    CSN: [id number] Arrival date & time: 12/19/20  1240      History   Chief Complaint Chief Complaint  Patient presents with  . Sore Throat  . Fever    HPI Michele Hodge is a 35 y.o. female.   Patient presents today with a 5-day history of sore throat.  Reports she initially had a fever but this resolved after 48 hours.  She reports associated mild cough but denies additional symptoms.  Denies any shortness of breath, nausea, vomiting, headache, dizziness, body aches.  She reports pain is rated 8 on a 0-10 pain scale, localized to posterior oropharynx without radiation, worse with swallowing, no alleviating factors identified.  She denies any recent antibiotic use.  She is up-to-date on influenza and COVID-19 vaccinations including booster.  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  She denies any muffled voice, inability to swallow, shortness of breath.  She has tried over-the-counter allergy medication and Tylenol/cold and flu without improvement of symptoms.  She does not smoke and denies history of asthma, allergies, COPD.     Past Medical History:  Diagnosis Date  . Healthy adult on routine physical examination     Patient Active Problem List   Diagnosis Date Noted  . S/P laparoscopic cholecystectomy 03/15/2018  . Gastroesophageal reflux disease 04/17/2017  . Overweight (BMI 25.0-29.9) 12/26/2016    History reviewed. No pertinent surgical history.  OB History   No obstetric history on file.      Home Medications    Prior to Admission medications   Medication Sig Start Date End Date Taking? Authorizing Provider  amoxicillin-clavulanate (AUGMENTIN) 875-125 MG tablet Take 1 tablet by mouth every 12 (twelve) hours. 12/19/20  Yes Savannah Erbe K, PA-C  lidocaine (XYLOCAINE) 2 % solution Use as directed 15 mLs in the mouth or throat as needed for mouth pain. spit 12/19/20   Yes Houston Surges K, PA-C  acetaminophen (TYLENOL) 500 MG tablet Take 500 mg by mouth at bedtime as needed.    [provider]  LO LOESTRIN FE 1 MG-10 MCG / 10 MCG tablet TAKE 1 TABLET BY MOUTH EVERY DAY 12/19/18   Karamalegos, Netta Neat, DO  norelgestromin-ethinyl estradiol (ORTHO EVRA) 150-35 MCG/24HR transdermal patch Place 1 patch onto the skin and leave in place for 7 days.  Change patch every 7 days for 21 days. Then go without a patch for 7 days.  Repeat every 28 days. 02/05/19   Galen Manila, NP    Family History Family History  Problem Relation Age of Onset  . Diabetes Father 82  . Stroke Maternal Grandmother   . CAD Maternal Grandfather   . Stroke Paternal Grandmother   . Schizophrenia Brother 20  . Breast cancer Neg Hx   . Colon cancer Neg Hx   . Ovarian cancer Neg Hx     Social History Social History   Tobacco Use  . Smoking status: Never Smoker  . Smokeless tobacco: Current User  Vaping Use  . Vaping Use: Never used  Substance Use Topics  . Alcohol use: Yes    Alcohol/week: 1.0 standard drink    Types: 1 Glasses of wine per week    Comment: occasional use  . Drug use: Yes    Types: Marijuana     Allergies   Patient has no known allergies.   Review of Systems Review of Systems  Constitutional:  Positive for activity change. Negative for appetite change, fatigue and fever (resolved).  HENT: Positive for sore throat and trouble swallowing (pain). Negative for congestion, sinus pressure and sneezing.   Respiratory: Positive for cough. Negative for shortness of breath.   Cardiovascular: Negative for chest pain.  Gastrointestinal: Negative for abdominal pain, diarrhea, nausea and vomiting.  Musculoskeletal: Negative for arthralgias and myalgias.  Neurological: Negative for dizziness, light-headedness and headaches.     Physical Exam Triage Vital Signs ED Triage Vitals [12/19/20 1421]  Enc Vitals Group     BP (!) 126/91     Pulse Rate 92      Resp 16     Temp 99.4 F (37.4 C)     Temp Source Oral     SpO2 99 %     Weight      Height      Head Circumference      Peak Flow      Pain Score 8     Pain Loc      Pain Edu?      Excl. in GC?    No data found.  Updated Vital Signs BP (!) 126/91 (BP Location: Left Arm)   Pulse 92   Temp 99.4 F (37.4 C) (Oral)   Resp 16   LMP 12/16/2020   SpO2 99%   Visual Acuity Right Eye Distance:   Left Eye Distance:   Bilateral Distance:    Right Eye Near:   Left Eye Near:    Bilateral Near:     Physical Exam Vitals reviewed.  Constitutional:      General: She is awake. She is not in acute distress.    Appearance: Normal appearance. She is not ill-appearing.     Comments: Very pleasant female appears stated age in no acute distress  HENT:     Head: Normocephalic and atraumatic.     Right Ear: Tympanic membrane, ear canal and external ear normal. Tympanic membrane is not erythematous or bulging.     Left Ear: Tympanic membrane, ear canal and external ear normal. Tympanic membrane is not erythematous or bulging.     Nose: Nose normal.     Right Sinus: No maxillary sinus tenderness or frontal sinus tenderness.     Left Sinus: No maxillary sinus tenderness or frontal sinus tenderness.     Mouth/Throat:     Pharynx: Uvula midline. Posterior oropharyngeal erythema present. No oropharyngeal exudate.     Tonsils: No tonsillar exudate or tonsillar abscesses. 1+ on the right. 1+ on the left.     Comments: Moderate erythema posterior oropharynx.  No exudate noted.  No evidence of tonsillar abscess. Cardiovascular:     Rate and Rhythm: Normal rate and regular rhythm.     Heart sounds: Normal heart sounds. No murmur heard.   Pulmonary:     Effort: Pulmonary effort is normal.     Breath sounds: Normal breath sounds. No wheezing, rhonchi or rales.     Comments: Clear to auscultation bilaterally Musculoskeletal:     Cervical back: Normal range of motion and neck supple.   Lymphadenopathy:     Head:     Right side of head: No submental, submandibular or tonsillar adenopathy.     Left side of head: No submental, submandibular or tonsillar adenopathy.     Cervical: No cervical adenopathy.  Psychiatric:        Behavior: Behavior is cooperative.      UC Treatments / Results  Labs (all labs ordered  are listed, but only abnormal results are displayed) Labs Reviewed  CULTURE, GROUP A STREP ([facility])  SARS CORONAVIRUS 2 (TAT 6-24 HRS)  POCT RAPID STREP A, ED / UC  POC INFLUENZA A AND B ANTIGEN (URGENT CARE ONLY)  POCT INFECTIOUS MONO SCREEN, ED / UC    EKG   Radiology No results found.  Procedures Procedures (including critical care time)  Medications Ordered in UC Medications - No data to display  Initial Impression / Assessment and Plan / UC Course  I have reviewed the triage vital signs and the nursing notes.  Pertinent labs & imaging results that were available during my care of the patient were reviewed by me and considered in my medical decision making (see chart for details).     Strep and flu were negative.  Patient did have similar sore throat with COVID and so she was tested for this condition as well-results pending.  Patient has clinical presentation of tonsillitis.  Unfortunately, we were unable to evaluate her for mono as we do not have appropriate supplies and could not draw blood on patient.  Discussed that taking amoxicillin if she did have mono could lead to a rash and she should stop the medication immediately and be evaluated.  She was given lidocaine solution to help with symptoms and instructed not to eat or drink immediately following use of this medication to prevent choking.  Discussed that antibiotics can decrease effectiveness of birth control and she should use backup birth control methods while on antibiotics.  She has a follow-up appointment scheduled with her PCP on 12/23/2020 and was strongly encouraged to keep this  appointment.  Discussed alarm symptoms that warrant emergent evaluation.  Strict return precautions given to which patient expressed understanding  Final Clinical Impressions(s) / UC Diagnoses   Final diagnoses:  Sore throat  Acute tonsillitis, unspecified etiology  Fever, unspecified     Discharge Instructions     Your strep test and flu test were negative.  As we discussed, I am concerned for throat infection but we were unable to rule out that this was mononucleosis.  We are going to call in antibiotics and if your symptoms persist another 24 hours or if at any point they worsen please start this.  If you do have mono and you take this medication you will develop a rash please stop it immediately and be seen.  I have called in viscous lidocaine to help with your symptoms.  Please do not use this directly before eating or drinking as it increases the risk of choking.  We will be in touch with your other results once we have them.  Please follow-up with your primary care doctor as we discussed on Wednesday.  If you have any worsening symptoms including fever not responding to medication, nausea/vomiting preventing you from eating and drinking, muffled voice, inability to swallow you need to go to the emergency room.    ED Prescriptions    Medication Sig Dispense Auth. Provider   amoxicillin-clavulanate (AUGMENTIN) 875-125 MG tablet Take 1 tablet by mouth every 12 (twelve) hours. 14 tablet Leston Schueller K, PA-C   lidocaine (XYLOCAINE) 2 % solution Use as directed 15 mLs in the mouth or throat as needed for mouth pain. spit 100 mL Vinita Prentiss K, PA-C     PDMP not reviewed this encounter.   Jeani Hawking, PA-C 12/19/20 1653

## 2020-12-20 LAB — SARS CORONAVIRUS 2 (TAT 6-24 HRS): SARS Coronavirus 2: NEGATIVE

## 2020-12-22 ENCOUNTER — Encounter: Payer: Self-pay | Admitting: Internal Medicine

## 2020-12-22 ENCOUNTER — Other Ambulatory Visit: Payer: Self-pay

## 2020-12-22 ENCOUNTER — Ambulatory Visit: Payer: BC Managed Care – PPO | Admitting: Internal Medicine

## 2020-12-22 ENCOUNTER — Other Ambulatory Visit (HOSPITAL_COMMUNITY)
Admission: RE | Admit: 2020-12-22 | Discharge: 2020-12-22 | Disposition: A | Discharge status: Home / Self Care | Payer: BC Managed Care – PPO | Source: Ambulatory Visit | Attending: Internal Medicine | Admitting: Internal Medicine

## 2020-12-22 VITALS — BP 91/65 | HR 85 | Temp 97.8°F | Resp 17 | Ht 71.0 in | Wt 225.8 lb

## 2020-12-22 DIAGNOSIS — Z113 Encounter for screening for infections with a predominantly sexual mode of transmission: Secondary | ICD-10-CM | POA: Diagnosis present

## 2020-12-22 DIAGNOSIS — Z6831 Body mass index (BMI) 31.0-31.9, adult: Secondary | ICD-10-CM | POA: Diagnosis not present

## 2020-12-22 DIAGNOSIS — J029 Acute pharyngitis, unspecified: Secondary | ICD-10-CM | POA: Diagnosis not present

## 2020-12-22 DIAGNOSIS — E6609 Other obesity due to excess calories: Secondary | ICD-10-CM

## 2020-12-22 DIAGNOSIS — T8332XA Displacement of intrauterine contraceptive device, initial encounter: Secondary | ICD-10-CM

## 2020-12-22 LAB — CULTURE, GROUP A STREP (THRC)

## 2020-12-22 MED ORDER — TRIAMCINOLONE ACETONIDE 40 MG/ML IJ SUSP
40.0000 mg | Freq: Once | INTRAMUSCULAR | Status: AC
  Administered 2020-12-22: 40 mg via INTRAMUSCULAR

## 2020-12-22 NOTE — Progress Notes (Signed)
Subjective:    Patient ID: Michele Hodge, female    DOB: 1986/05/06, 35 y.o.   MRN: [id number]  HPI  Pt presents to the clinic today for STD testing. She reports she has been kissed by someone who has herpes, but she has not noticed any lesions around the mouth or in the vaginal area. She denies pelvic pain, vaginal discharge, vaginal odor, vaginal itching or irritation or dysuria. She would like a full STD screen today.  She also has some concerns about her IUD. She reports she recently went to a chiropractor to discuss her hip pain. They did an xray and told her that her IUD was out of place. She has been having heavier menstrual cycles than usual. She denies pelvic pain. She reports her Mirena was placed 3 years ago.  She also reports sore throat. She was seen at [facility] 6/4 for the same. She reports strep and covid test was negative. They advised her to get Cholraseptic spray but the pharmacy was out. They did call in antibiotics in case she got worse but she did not take these and she feels slightly better. She reports they wanted to test her for mono but they could not get enough blood to do so.   Review of Systems  Past Medical History:  Diagnosis Date  . Healthy adult on routine physical examination     Current Outpatient Medications  Medication Sig Dispense Refill  . acetaminophen (TYLENOL) 500 MG tablet Take 500 mg by mouth at bedtime as needed.    Marland Kitchen amoxicillin-clavulanate (AUGMENTIN) 875-125 MG tablet Take 1 tablet by mouth every 12 (twelve) hours. 14 tablet 0  . lidocaine (XYLOCAINE) 2 % solution Use as directed 15 mLs in the mouth or throat as needed for mouth pain. spit 100 mL 0  . LO LOESTRIN FE 1 MG-10 MCG / 10 MCG tablet TAKE 1 TABLET BY MOUTH EVERY DAY 84 tablet 0  . norelgestromin-ethinyl estradiol (ORTHO EVRA) 150-35 MCG/24HR transdermal patch Place 1 patch onto the skin and leave in place for 7 days.  Change patch every 7 days for 21 days. Then go without a patch for 7  days.  Repeat every 28 days. 3 patch 12   No current facility-administered medications for this visit.    No Known Allergies  Family History  Problem Relation Age of Onset  . Diabetes Father 5  . Stroke Maternal Grandmother   . CAD Maternal Grandfather   . Stroke Paternal Grandmother   . Schizophrenia Brother 20  . Breast cancer Neg Hx   . Colon cancer Neg Hx   . Ovarian cancer Neg Hx     Social History   Socioeconomic History  . Marital status: Single    Spouse name: Not on file  . Number of children: Not on file  . Years of education: Not on file  . Highest education level: Not on file  Occupational History  . Not on file  Tobacco Use  . Smoking status: Never Smoker  . Smokeless tobacco: Current User  Vaping Use  . Vaping Use: Never used  Substance and Sexual Activity  . Alcohol use: Yes    Alcohol/week: 1.0 standard drink    Types: 1 Glasses of wine per week    Comment: occasional use  . Drug use: Yes    Types: Marijuana  . Sexual activity: Never  Other Topics Concern  . Not on file  Social History Narrative  . Not on file   Social  Determinants of Health   Financial Resource Strain: Not on file  Food Insecurity: Not on file  Transportation Needs: Not on file  Physical Activity: Not on file  Stress: Not on file  Social Connections: Not on file  Intimate Partner Violence: Not on file     Constitutional: Denies fever, malaise, fatigue, headache or abrupt weight changes.  HEENT: Pt reports sore throat. Denies eye pain, eye redness, ear pain, ringing in the ears, wax buildup, runny nose, nasal congestion, bloody nose, or sore throat. Respiratory: Denies difficulty breathing, shortness of breath, cough or sputum production.   Cardiovascular: Denies chest pain, chest tightness, palpitations or swelling in the hands or feet.  Gastrointestinal: Denies abdominal pain, bloating, constipation, diarrhea or blood in the stool.  GU: Denies urgency, frequency, pain  with urination, burning sensation, blood in urine, odor or discharge. Skin: Denies redness, rashes, lesions or ulcercations.   No other specific complaints in a complete review of systems (except as listed in HPI above).     Objective:   Physical Exam   BP 91/65 (BP Location: Right Arm, Patient Position: Sitting, Cuff Size: Large)   Pulse 85   Temp 97.8 F (36.6 C)   Resp 17   Ht 5\' 11"  (1.803 m)   Wt 225 lb 12.8 oz (102.4 kg)   LMP 12/16/2020   BMI 31.49 kg/m   Wt Readings from Last 3 Encounters:  02/05/19 227 lb 12.8 oz (103.3 kg)  03/15/18 204 lb 6.4 oz (92.7 kg)  12/29/17 212 lb (96.2 kg)    General: Appears her stated age, obese, in NAD. Skin: Warm, dry and intact. No lesions or ulcerations noted. HEENT: Head: normal shape and size; Throat: mucosa erythematous and moist, no exudate, lesions or ulcerations noted.  Cardiovascular: Normal rate and rhythm. S1,S2 noted.  No murmur, rubs or gallops noted.  Pulmonary/Chest: Normal effort and positive vesicular breath sounds. No respiratory distress. No wheezes, rales or ronchi noted.  Musculoskeletal:  No difficulty with gait.  Neurological: Alert and oriented.  BMET    Component Value Date/Time   NA 139 02/06/2019 0853   K 4.2 02/06/2019 0853   CL 106 02/06/2019 0853   CO2 27 02/06/2019 0853   GLUCOSE 84 02/06/2019 0853   BUN 10 02/06/2019 0853   CREATININE 0.84 02/06/2019 0853   CALCIUM 9.3 02/06/2019 0853   GFRNONAA 91 02/06/2019 0853   GFRAA 106 02/06/2019 0853    Lipid Panel     Component Value Date/Time   CHOL 190 02/06/2019 0853   TRIG 258 (H) 02/06/2019 0853   HDL 51 02/06/2019 0853   CHOLHDL 3.7 02/06/2019 0853   VLDL 33 (H) 12/26/2016 0909   LDLCALC 102 (H) 02/06/2019 0853    CBC    Component Value Date/Time   WBC 8.5 02/06/2019 0853   RBC 4.38 02/06/2019 0853   HGB 13.5 02/06/2019 0853   HCT 40.6 02/06/2019 0853   PLT 300 02/06/2019 0853   MCV 92.7 02/06/2019 0853   MCH 30.8 02/06/2019  0853   MCHC 33.3 02/06/2019 0853   RDW 11.9 02/06/2019 0853   LYMPHSABS 3,434 02/06/2019 0853   MONOABS 0.5 12/29/2017 0308   EOSABS 111 02/06/2019 0853   BASOSABS 68 02/06/2019 0853    Hgb A1C Lab Results  Component Value Date   HGBA1C 5.4 02/06/2019           Assessment & Plan:   Screening for STD:  Will check urine gonorrhea, chlamydia, trichomoniasis, gardnerella and yeast Will  check HIV, RPR, HIV, Hep C, HSV 1&2 IgG and IgM  Sore Throat:  UC notes and labs reviewed Triamcinolone 40 mg IM x 1 Will check mono spot  IUD Migration on Xray:  Pelvic and transvaginal ultrasound ordered  Return precautions discussed [name], NP This visit occurred during the SARS-CoV-2 public health emergency.  Safety protocols were in place, including screening questions prior to the visit, additional usage of staff PPE, and extensive cleaning of exam room while observing appropriate contact time as indicated for disinfecting solutions.

## 2020-12-22 NOTE — Patient Instructions (Signed)

## 2020-12-23 ENCOUNTER — Other Ambulatory Visit: Payer: BC Managed Care – PPO

## 2020-12-23 ENCOUNTER — Other Ambulatory Visit: Payer: Self-pay | Admitting: Internal Medicine

## 2020-12-24 LAB — URINE CYTOLOGY ANCILLARY ONLY
Bacterial Vaginitis-Urine: NEGATIVE
Candida Urine: NEGATIVE
Chlamydia: NEGATIVE
Comment: NEGATIVE
Comment: NEGATIVE
Comment: NORMAL
Neisseria Gonorrhea: NEGATIVE
Trichomonas: NEGATIVE

## 2020-12-26 LAB — RPR: RPR Ser Ql: NONREACTIVE

## 2020-12-26 LAB — HIV ANTIBODY (ROUTINE TESTING W REFLEX): HIV 1&2 Ab, 4th Generation: NONREACTIVE

## 2020-12-26 LAB — HSV(HERPES SIMPLEX VRS) I + II AB-IGG
HAV 1 IGG,TYPE SPECIFIC AB: <0.9 index
HSV 2 IGG,TYPE SPECIFIC AB: <0.9 index

## 2020-12-26 LAB — HSV 1/2 AB (IGM), IFA W/RFLX TITER
HSV 1 IgM Screen: NEGATIVE
HSV 2 IgM Screen: NEGATIVE

## 2020-12-26 LAB — MONONUCLEOSIS SCREEN: Heterophile, Mono Screen: NEGATIVE

## 2021-01-12 ENCOUNTER — Ambulatory Visit: Payer: BC Managed Care – PPO

## 2021-01-26 ENCOUNTER — Ambulatory Visit: Payer: BC Managed Care – PPO | Attending: Internal Medicine

## 2021-02-01 ENCOUNTER — Ambulatory Visit: Payer: BC Managed Care – PPO | Admitting: Internal Medicine

## 2021-02-17 ENCOUNTER — Other Ambulatory Visit: Payer: Self-pay

## 2021-02-17 ENCOUNTER — Telehealth: Payer: Self-pay

## 2021-02-17 ENCOUNTER — Other Ambulatory Visit (INDEPENDENT_AMBULATORY_CARE_PROVIDER_SITE_OTHER): Payer: Self-pay | Admitting: Pharmacist

## 2021-02-17 ENCOUNTER — Other Ambulatory Visit (HOSPITAL_COMMUNITY): Payer: Self-pay

## 2021-02-17 DIAGNOSIS — Z79899 Other long term (current) drug therapy: Secondary | ICD-10-CM

## 2021-02-17 DIAGNOSIS — Z113 Encounter for screening for infections with a predominantly sexual mode of transmission: Secondary | ICD-10-CM

## 2021-02-17 NOTE — Telephone Encounter (Signed)
[facility] Patient Advocate Encounter  Completed and sent Vii[employer] application for APRETUDE for this patient who is uninsured.    Patient assistance phone number for follow up is [phone].   This encounter will be updated until final determination.   Clearance Coots, CPhT Specialty Pharmacy Patient [facility] Phone: [phone] Fax:  [phone]

## 2021-02-17 NOTE — Telephone Encounter (Signed)
[facility] Patient Advocate Encounter ? ?Insurance verification completed.   ? ?The patient is uninsured and will need patient assistance for medication. ? ?We can complete the application and will need to meet with the patient for signatures and income documentation. ? ?Jian Hodgman, CPhT ?Specialty Pharmacy Patient Advocate ?[facility] ?Phone: [phone] ?Fax:  [phone]  ?

## 2021-02-17 NOTE — Progress Notes (Signed)
Date:  02/17/2021   HPI: Michele Hodge is a 35 y.o. female who presents to the [facility] pharmacy clinic to discuss and initiate PrEP.  Insured   []    Uninsured  [x]    Patient Active Problem List   Diagnosis Date Noted   S/P laparoscopic cholecystectomy 03/15/2018   Gastroesophageal reflux disease 04/17/2017   Overweight (BMI 25.0-29.9) 12/26/2016    Patient's Medications  New Prescriptions   No medications on file  Previous Medications   AMPHETAMINE-DEXTROAMPHETAMINE (ADDERALL) 10 MG TABLET    Take 10 mg by mouth every morning.   ATOMOXETINE (STRATTERA) 60 MG CAPSULE    Take 60 mg by mouth daily.   LEVONORGESTREL (MIRENA) 20 MCG/DAY IUD    by Intrauterine route.  Modified Medications   No medications on file  Discontinued Medications   No medications on file    Allergies: No Known Allergies  Past Medical History: Past Medical History:  Diagnosis Date   Healthy adult on routine physical examination     Social History: Social History   Socioeconomic History   Marital status: Single    Spouse name: Not on file   Number of children: Not on file   Years of education: Not on file   Highest education level: Not on file  Occupational History   Not on file  Tobacco Use   Smoking status: Never   Smokeless tobacco: Never  Vaping Use   Vaping Use: Never used  Substance and Sexual Activity   Alcohol use: Yes    Alcohol/week: 1.0 standard drink    Types: 1 Glasses of wine per week    Comment: occasional use   Drug use: Yes    Types: Marijuana   Sexual activity: Never  Other Topics Concern   Not on file  Social History Narrative   Not on file   Social Determinants of Health   Financial Resource Strain: Not on file  Food Insecurity: Not on file  Transportation Needs: Not on file  Physical Activity: Not on file  Stress: Not on file  Social Connections: Not on file    No flowsheet data found.  Labs:  SCr: Lab Results  Component Value Date   CREATININE  0.84 02/06/2019   CREATININE 0.91 12/29/2017   CREATININE 0.82 12/26/2016   HIV Lab Results  Component Value Date   HIV NON-REACTIVE 12/23/2020   HIV NON-REACTIVE 02/06/2019   HIV Non Reactive 04/21/2017   HIV NONREACTIVE 12/26/2016   Hepatitis B No results found for: HEPBSAB, HEPBSAG, HEPBCAB Hepatitis C No results found for: HEPCAB, HCVRNAPCRQN Hepatitis A No results found for: HAV RPR and STI Lab Results  Component Value Date   LABRPR NON-REACTIVE 12/23/2020   LABRPR NON-REACTIVE 02/06/2019   LABRPR Non Reactive 04/21/2017   LABRPR NON REAC 12/26/2016    STI Results GC GC CT CT  12/22/2020 Negative - Negative -  02/05/2019 Negative - Negative -  04/17/2017 - - Negative -  12/26/2016 - NOT DETECTED - NOT DETECTED    Assessment: Michele Hodge is here today to discuss and initiate medication for HIV PrEP. She was seen briefly yesterday when her boyfriend saw Dr. 02/25/2017.  She did not know much about PrEP at baseline. She is sexually active with her boyfriend only and they do not use condoms. She has never been screened for HIV. Discussed different options for PrEP, and she decided to proceed with Apretude. She is uninsured, so she filled out an application today. Will do all baseline screening and  bring her back next week to start Apretude if she is HIV negative.   Plan: - HIV antibody, HIV RNA, BMET, RPR, urine gonorrhea/chlamydia cytology, hepatitis B surface antigen, hepatitis B surface antibody, hepatitis A antibody, hepatitis C antibody today - F/u next week for Apretude if HIV negative  Michele Hodge L. Daeja Helderman, PharmD, BCIDP, AAHIVP, CPP Clinical Pharmacist Practitioner Infectious Diseases Clinical Pharmacist [facility] 02/17/2021, 4:19 PM

## 2021-02-18 ENCOUNTER — Telehealth: Payer: Self-pay | Admitting: Pharmacist

## 2021-02-18 ENCOUNTER — Encounter: Payer: Self-pay | Admitting: Internal Medicine

## 2021-02-18 ENCOUNTER — Telehealth: Payer: Self-pay

## 2021-02-18 LAB — URINE CYTOLOGY ANCILLARY ONLY
Chlamydia: NEGATIVE
Comment: NEGATIVE
Comment: NORMAL
Neisseria Gonorrhea: NEGATIVE

## 2021-02-18 MED ORDER — APRETUDE 600 MG/3ML IM SUER: 600.0000 mg | INTRAMUSCULAR | 5 refills | Status: DC

## 2021-02-18 NOTE — Telephone Encounter (Signed)
[facility] Patient Advocate Encounter  Completed and sent ViiVConnect application for Apretude for this patient who is uninsured.    Patient is approved 02/18/21 through 02/18/22.     Clearance Coots, CPhT Specialty Pharmacy Patient [facility] for [facility] Phone: [phone] Fax:  [phone]

## 2021-02-18 NOTE — Telephone Encounter (Signed)
Called patient to relay lab results. All labs are negative (HIV, gonorrhea, syphilis, chlamydia, Hepatitis A/C/B). She does have immunity against Hepatitis A and B. She will come next week for her first Apretude injection for PrEP.

## 2021-02-20 LAB — BASIC METABOLIC PANEL
BUN: 10 mg/dL (ref 7–25)
CO2: 26 mmol/L (ref 20–32)
Calcium: 9.2 mg/dL (ref 8.6–10.2)
Chloride: 103 mmol/L (ref 98–110)
Creat: 0.81 mg/dL (ref 0.50–0.97)
Glucose, Bld: 87 mg/dL (ref 65–99)
Potassium: 4.5 mmol/L (ref 3.5–5.3)
Sodium: 139 mmol/L (ref 135–146)

## 2021-02-20 LAB — HEPATITIS C ANTIBODY
Hepatitis C Ab: NONREACTIVE
SIGNAL TO CUT-OFF: 0.01 (ref <1.00)

## 2021-02-20 LAB — RPR: RPR Ser Ql: NONREACTIVE

## 2021-02-20 LAB — HIV ANTIBODY (ROUTINE TESTING W REFLEX): HIV 1&2 Ab, 4th Generation: NONREACTIVE

## 2021-02-20 LAB — HIV-1 RNA ULTRAQUANT REFLEX TO GENTYP+
HIV 1 RNA Quant: NOT DETECTED Copies/mL
HIV-1 RNA Quant, Log: NOT DETECTED Log cps/mL

## 2021-02-20 LAB — HEPATITIS A ANTIBODY, TOTAL: Hepatitis A AB,Total: REACTIVE — AB

## 2021-02-20 LAB — HEPATITIS B SURFACE ANTIGEN: Hepatitis B Surface Ag: NONREACTIVE

## 2021-02-20 LAB — HEPATITIS B SURFACE ANTIBODY,QUALITATIVE: Hep B S Ab: REACTIVE — AB

## 2021-02-23 ENCOUNTER — Telehealth: Payer: Self-pay

## 2021-02-23 ENCOUNTER — Other Ambulatory Visit: Payer: Self-pay | Admitting: Pharmacist

## 2021-02-23 DIAGNOSIS — Z79899 Other long term (current) drug therapy: Secondary | ICD-10-CM

## 2021-02-23 NOTE — Telephone Encounter (Signed)
[facility] Patient Advocate Encounter  Patient's medication (Apretude) have been couriered to [facility] from [employer] and will be administered on patient next office visit on 02/24/21.   (ViiVConnect Patient Assistance Program)  Clearance Coots , CPhT Specialty Pharmacy Patient [facility] for [facility] Phone: [phone] Fax:  [phone]

## 2021-02-24 ENCOUNTER — Other Ambulatory Visit: Payer: Self-pay

## 2021-02-24 ENCOUNTER — Ambulatory Visit (INDEPENDENT_AMBULATORY_CARE_PROVIDER_SITE_OTHER): Payer: Self-pay | Admitting: Pharmacist

## 2021-02-24 DIAGNOSIS — Z79899 Other long term (current) drug therapy: Secondary | ICD-10-CM

## 2021-02-24 MED ORDER — CABOTEGRAVIR ER 600 MG/3ML IM SUER
600.0000 mg | Freq: Once | INTRAMUSCULAR | Status: AC
  Administered 2021-02-24: 600 mg via INTRAMUSCULAR

## 2021-02-24 NOTE — Progress Notes (Signed)
HPI: Michele Hodge is a 35 y.o. female who presents to the [facility] pharmacy clinic for Apretude administration and HIV PrEP follow up.  Patient Active Problem List   Diagnosis Date Noted   S/P laparoscopic cholecystectomy 03/15/2018   Gastroesophageal reflux disease 04/17/2017   Overweight (BMI 25.0-29.9) 12/26/2016    Patient's Medications  New Prescriptions   No medications on file  Previous Medications   AMPHETAMINE-DEXTROAMPHETAMINE (ADDERALL) 10 MG TABLET    Take 10 mg by mouth every morning.   ATOMOXETINE (STRATTERA) 60 MG CAPSULE    Take 60 mg by mouth daily.   CABOTEGRAVIR ER (APRETUDE) 600 MG/3ML INJECTION    Inject 3 mLs (600 mg total) into the muscle every 2 (two) months.   LEVONORGESTREL (MIRENA) 20 MCG/DAY IUD    by Intrauterine route.  Modified Medications   No medications on file  Discontinued Medications   No medications on file    Allergies: No Known Allergies  Past Medical History: Past Medical History:  Diagnosis Date   Healthy adult on routine physical examination     Social History: Social History   Socioeconomic History   Marital status: Single    Spouse name: Not on file   Number of children: Not on file   Years of education: Not on file   Highest education level: Not on file  Occupational History   Not on file  Tobacco Use   Smoking status: Never   Smokeless tobacco: Never  Vaping Use   Vaping Use: Never used  Substance and Sexual Activity   Alcohol use: Yes    Alcohol/week: 1.0 standard drink    Types: 1 Glasses of wine per week    Comment: occasional use   Drug use: Yes    Types: Marijuana   Sexual activity: Never  Other Topics Concern   Not on file  Social History Narrative   Not on file   Social Determinants of Health   Financial Resource Strain: Not on file  Food Insecurity: Not on file  Transportation Needs: Not on file  Physical Activity: Not on file  Stress: Not on file  Social Connections: Not on file     Labs: Lab Results  Component Value Date   HIV1RNAQUANT Not Detected 02/17/2021    RPR and STI Lab Results  Component Value Date   LABRPR NON-REACTIVE 02/17/2021   LABRPR NON-REACTIVE 12/23/2020   LABRPR NON-REACTIVE 02/06/2019   LABRPR Non Reactive 04/21/2017   LABRPR NON REAC 12/26/2016    STI Results GC GC CT CT  02/17/2021 Negative - Negative -  12/22/2020 Negative - Negative -  02/05/2019 Negative - Negative -  04/17/2017 - - Negative -  12/26/2016 - NOT DETECTED - NOT DETECTED    Hepatitis B Lab Results  Component Value Date   HEPBSAB REACTIVE (A) 02/17/2021   HEPBSAG NON-REACTIVE 02/17/2021   Hepatitis C Lab Results  Component Value Date   HEPCAB NON-REACTIVE 02/17/2021   Hepatitis A Lab Results  Component Value Date   HAV REACTIVE (A) 02/17/2021   Lipids: Lab Results  Component Value Date   CHOL 190 02/06/2019   TRIG 258 (H) 02/06/2019   HDL 51 02/06/2019   CHOLHDL 3.7 02/06/2019   VLDL 33 (H) 12/26/2016   LDLCALC 102 (H) 02/06/2019    TARGET DATE: The 10th of the month  Assessment: Yukari presents today for their first initiation injection of Apretude and to follow up for HIV PrEP.  Counseled that Apretude is one intramuscular injection in  the gluteal muscle for each visit. Explained that the second injection is 30 days after the initial injection then every 2 months thereafter. Discussed follow up appointments moving forward. It is required to have a negative HIV test immediately prior to injection administration. A rapid HIV blood test will be drawn each visit, and patient must wait for results before getting injection, which typically takes 20-30 minutes. Will make lab appointments for each injection 30 minutes prior to injection appointment. Screened patient for acute HIV symptoms such as fatigue, muscle aches, rash, sore throat, lymphadenopathy, headache, night sweats, nausea/vomiting/diarrhea, and fever. Patient denies any symptoms.   Explained  that showing up to injection appointments is very important and warned that if appointments are missed, protection will be minimal and the risk of acquiring HIV becomes much higher. Counseled on possible side effects associated with the injections such as injection site pain, which is usually mild to moderate in nature, injection site nodules, and injection site reactions. Asked to call the clinic or send me a mychart message if they experience any issues. Advised that they can take Motrin or Tylenol for injection site pain if needed. They may also pre-treat with Motrin or Tylenol 30-45 minutes before scheduled appointments.   Rapid HIV antibody blood test was drawn immediately prior to injection and was negative. Patient also had HIV RNA drawn today. Administered cabotegravir 600mg /85mL in left upper outer quadrant of the gluteal muscle. Monitored patient for 10 minutes after injection. Injection was tolerated well without issue. Counseled to call with any issues that may arise. Also counseled that condoms are still advised and encouraged. Will make follow up appointments for second initiation injection in 30 days and then maintenance injections every 2 months thereafter.   Plan: - First Apretude injection administered - Second initiation injection scheduled for 03/30/21 - Call with any issues or questions  Bonne Whack L. Isador Castille, PharmD, BCIDP, AAHIVP, CPP Clinical Pharmacist Practitioner Infectious Diseases Clinical Pharmacist [facility]

## 2021-02-26 LAB — HIV-1 RNA QUANT-NO REFLEX-BLD
HIV 1 RNA Quant: NOT DETECTED Copies/mL
HIV-1 RNA Quant, Log: NOT DETECTED Log cps/mL

## 2021-03-25 ENCOUNTER — Telehealth: Payer: Self-pay

## 2021-03-25 NOTE — Telephone Encounter (Signed)
[facility] Patient Advocate Encounter  Patient's medication( Apretude) have been couriered to [facility] from [facility] and will be administered on the patient next office visit on 03/30/21.  Clearance Coots , CPhT Specialty Pharmacy Patient [facility] Phone: [phone] Fax:  [phone]

## 2021-03-30 ENCOUNTER — Other Ambulatory Visit: Payer: Self-pay

## 2021-03-30 ENCOUNTER — Ambulatory Visit (INDEPENDENT_AMBULATORY_CARE_PROVIDER_SITE_OTHER): Payer: Self-pay | Admitting: Pharmacist

## 2021-03-30 DIAGNOSIS — Z79899 Other long term (current) drug therapy: Secondary | ICD-10-CM

## 2021-03-30 MED ORDER — CABOTEGRAVIR ER 600 MG/3ML IM SUER
600.0000 mg | Freq: Once | INTRAMUSCULAR | Status: AC
  Administered 2021-03-30: 600 mg via INTRAMUSCULAR

## 2021-03-30 NOTE — Progress Notes (Signed)
Date:  03/30/2021   HPI: Michele Hodge is a 35 y.o. female who presents to the [facility] pharmacy clinic for HIV PrEP follow-up.  Insured   []    Uninsured  [x]    Patient Active Problem List   Diagnosis Date Noted   S/P laparoscopic cholecystectomy 03/15/2018   Gastroesophageal reflux disease 04/17/2017   Overweight (BMI 25.0-29.9) 12/26/2016    Patient's Medications  New Prescriptions   No medications on file  Previous Medications   AMPHETAMINE-DEXTROAMPHETAMINE (ADDERALL) 10 MG TABLET    Take 10 mg by mouth every morning.   ATOMOXETINE (STRATTERA) 60 MG CAPSULE    Take 60 mg by mouth daily.   CABOTEGRAVIR ER (APRETUDE) 600 MG/3ML INJECTION    Inject 3 mLs (600 mg total) into the muscle every 2 (two) months.   LEVONORGESTREL (MIRENA) 20 MCG/DAY IUD    by Intrauterine route.  Modified Medications   No medications on file  Discontinued Medications   No medications on file    Allergies: No Known Allergies  Past Medical History: Past Medical History:  Diagnosis Date   Healthy adult on routine physical examination     Social History: Social History   Socioeconomic History   Marital status: Single    Spouse name: Not on file   Number of children: Not on file   Years of education: Not on file   Highest education level: Not on file  Occupational History   Not on file  Tobacco Use   Smoking status: Never   Smokeless tobacco: Never  Vaping Use   Vaping Use: Never used  Substance and Sexual Activity   Alcohol use: Yes    Alcohol/week: 1.0 standard drink    Types: 1 Glasses of wine per week    Comment: occasional use   Drug use: Yes    Types: Marijuana   Sexual activity: Never  Other Topics Concern   Not on file  Social History Narrative   Not on file   Social Determinants of Health   Financial Resource Strain: Not on file  Food Insecurity: Not on file  Transportation Needs: Not on file  Physical Activity: Not on file  Stress: Not on file  Social  Connections: Not on file    No flowsheet data found.  Labs:  SCr: Lab Results  Component Value Date   CREATININE 0.81 02/17/2021   CREATININE 0.84 02/06/2019   CREATININE 0.91 12/29/2017   CREATININE 0.82 12/26/2016   HIV Lab Results  Component Value Date   HIV NON-REACTIVE 02/17/2021   HIV NON-REACTIVE 12/23/2020   HIV NON-REACTIVE 02/06/2019   HIV Non Reactive 04/21/2017   HIV NONREACTIVE 12/26/2016   Hepatitis B Lab Results  Component Value Date   HEPBSAB REACTIVE (A) 02/17/2021   HEPBSAG NON-REACTIVE 02/17/2021   Hepatitis C Lab Results  Component Value Date   HEPCAB NON-REACTIVE 02/17/2021   Hepatitis A Lab Results  Component Value Date   HAV REACTIVE (A) 02/17/2021   RPR and STI Lab Results  Component Value Date   LABRPR NON-REACTIVE 02/17/2021   LABRPR NON-REACTIVE 12/23/2020   LABRPR NON-REACTIVE 02/06/2019   LABRPR Non Reactive 04/21/2017   LABRPR NON REAC 12/26/2016    STI Results GC GC CT CT  02/17/2021 Negative - Negative -  12/22/2020 Negative - Negative -  02/05/2019 Negative - Negative -  04/17/2017 - - Negative -  12/26/2016 - NOT DETECTED - NOT DETECTED    Assessment: Patient presents to clinic with partner for second Apretude injection  following first Apretude injection 02/24/21. Prior to administration of Apretude injection patient had a rapid HIV antibody test test drawn by the lab which was negative, and the patient was informed of the negative result.  Patient reports no issues with prior injection of Apretude.Patient has immunity to HepB/Hep A.  Patient was screened for STIs 02/17/21, so no testing warranted during this clinic visit. Patient was scheduled for 2 month follow-up with pharmacy for subsequent Apretude injection to which she voiced understanding.  The patient's partner is currently being treated by [facility] clinic for HIV and reports being out of his Biktarvy for ~1 week due to pharmacy not having his medication. Partner was provided  with one week's worth of samples of Biktarvy and was scheduled for first administration of Cabenuva in clinic 04/05/21.   Plan: Follow-up with lab in 2 months to confirm negative HIV status  Follow-up in pharmacy clinic in 2 months for next Apretude injection, STI screening  Follow-up HIV RNA   Jani Gravel, PharmD PGY-1 Acute Care Resident  03/30/2021 3:17 PM

## 2021-04-01 LAB — HIV-1 RNA QUANT-NO REFLEX-BLD
HIV 1 RNA Quant: NOT DETECTED Copies/mL
HIV-1 RNA Quant, Log: NOT DETECTED Log cps/mL

## 2021-05-10 ENCOUNTER — Telehealth: Payer: Self-pay | Admitting: Student-PharmD

## 2021-05-10 NOTE — Telephone Encounter (Signed)
Patient accompanied her partner to their visit with Cassie at [facility] last week. Her partner is on [place] and she is on Apretude but their every 2 month injections fall on opposite months. They asked if there is any way to get them on the same month and we needed to look into it and get back to them.   Per the Apretude package insert, if a missed dose is planned, oral cabotegravir can be given starting 2 months after target dose. If the missed dose is less than or equal to 1 month from the target date, the every 2 month dosing can be resumed. But if it's been >1 month since the missed target date, the loading doses have to be restarted (0 and 1 month, then every 2 months thereafter).   Her partner's target date for Renaldo Harrison is the 19th (next injection in December). Her target date for Apretude is the 10th (next injection in November). If she were to plan to miss her November dose, she would need to resume Apretude 12/10 in order to not resume loading doses, however 12/10 is a Saturday. Her partner's target date range is the 12th through the 26th. To best accommodate getting them on the same day in December and going forward, we could schedule them on 12/12 and be within his target range and close to hers (she would be within 30 days of her last injection and just a couple days off from target date). This would require her to take oral cabotegravir 11/13 through 12/12.   I called the patient to see if she would be interested in this. She does not want to take oral cabotegravir though she would like to get on the same schedule as her partner. Explained risks of not taking oral cabotegravir and that this is the recommended option. If she declines this, would recommend she and her partner use condoms or abstain from sex during the time she misses her injection. She said she would like to think about this and discuss with her partner and she will let us know if she wants to reschedule their next injections.

## 2021-05-24 ENCOUNTER — Other Ambulatory Visit: Payer: Self-pay

## 2021-05-24 ENCOUNTER — Ambulatory Visit (INDEPENDENT_AMBULATORY_CARE_PROVIDER_SITE_OTHER): Payer: Self-pay | Admitting: Pharmacist

## 2021-05-24 ENCOUNTER — Telehealth: Payer: Self-pay

## 2021-05-24 DIAGNOSIS — Z79899 Other long term (current) drug therapy: Secondary | ICD-10-CM

## 2021-05-24 MED ORDER — CABOTEGRAVIR ER 600 MG/3ML IM SUER
600.0000 mg | Freq: Once | INTRAMUSCULAR | Status: AC
  Administered 2021-05-24: 600 mg via INTRAMUSCULAR

## 2021-05-24 NOTE — Progress Notes (Signed)
HPI: Michele Hodge is a 35 y.o. female who presents to the [facility] pharmacy clinic for Apretude administration and HIV PrEP follow up.  Patient Active Problem List   Diagnosis Date Noted   S/P laparoscopic cholecystectomy 03/15/2018   Gastroesophageal reflux disease 04/17/2017   Overweight (BMI 25.0-29.9) 12/26/2016    Patient's Medications  New Prescriptions   No medications on file  Previous Medications   AMPHETAMINE-DEXTROAMPHETAMINE (ADDERALL) 10 MG TABLET    Take 10 mg by mouth every morning.   ATOMOXETINE (STRATTERA) 60 MG CAPSULE    Take 60 mg by mouth daily.   CABOTEGRAVIR ER (APRETUDE) 600 MG/3ML INJECTION    Inject 3 mLs (600 mg total) into the muscle every 2 (two) months.   LEVONORGESTREL (MIRENA) 20 MCG/DAY IUD    by Intrauterine route.  Modified Medications   No medications on file  Discontinued Medications   No medications on file    Allergies: No Known Allergies  Past Medical History: Past Medical History:  Diagnosis Date   Healthy adult on routine physical examination     Social History: Social History   Socioeconomic History   Marital status: Single    Spouse name: Not on file   Number of children: Not on file   Years of education: Not on file   Highest education level: Not on file  Occupational History   Not on file  Tobacco Use   Smoking status: Never   Smokeless tobacco: Never  Vaping Use   Vaping Use: Never used  Substance and Sexual Activity   Alcohol use: Yes    Alcohol/week: 1.0 standard drink    Types: 1 Glasses of wine per week    Comment: occasional use   Drug use: Yes    Types: Marijuana   Sexual activity: Never  Other Topics Concern   Not on file  Social History Narrative   Not on file   Social Determinants of Health   Financial Resource Strain: Not on file  Food Insecurity: Not on file  Transportation Needs: Not on file  Physical Activity: Not on file  Stress: Not on file  Social Connections: Not on file     Labs: Lab Results  Component Value Date   HIV1RNAQUANT Not Detected 03/30/2021   HIV1RNAQUANT Not Detected 02/24/2021   HIV1RNAQUANT Not Detected 02/17/2021    RPR and STI Lab Results  Component Value Date   LABRPR NON-REACTIVE 02/17/2021   LABRPR NON-REACTIVE 12/23/2020   LABRPR NON-REACTIVE 02/06/2019   LABRPR Non Reactive 04/21/2017   LABRPR NON REAC 12/26/2016    STI Results GC GC CT CT  02/17/2021 Negative - Negative -  12/22/2020 Negative - Negative -  02/05/2019 Negative - Negative -  04/17/2017 - - Negative -  12/26/2016 - NOT DETECTED - NOT DETECTED    Hepatitis B Lab Results  Component Value Date   HEPBSAB REACTIVE (A) 02/17/2021   HEPBSAG NON-REACTIVE 02/17/2021   Hepatitis C Lab Results  Component Value Date   HEPCAB NON-REACTIVE 02/17/2021   Hepatitis A Lab Results  Component Value Date   HAV REACTIVE (A) 02/17/2021   Lipids: Lab Results  Component Value Date   CHOL 190 02/06/2019   TRIG 258 (H) 02/06/2019   HDL 51 02/06/2019   CHOLHDL 3.7 02/06/2019   VLDL 33 (H) 12/26/2016   LDLCALC 102 (H) 02/06/2019    TARGET DATE: The 10th of the month  Assessment: Michele Hodge presents today for their Apretude injection and to follow up for HIV PrEP.  Past injection was tolerated well without issues. No problems with systemic side effects of injection. No signs or symptoms of anything today. Screened patient for acute HIV symptoms such as fatigue, muscle aches, rash, sore throat, lymphadenopathy, headache, night sweats, nausea/vomiting/diarrhea, and fever. Patient denies any symptoms. Rapid HIV antibody blood test was drawn immediately prior to injection and was negative. Patient also had HIV RNA drawn today.   Administered cabotegravir 600mg /22mL in left upper outer quadrant of the gluteal muscle. Monitored patient for 10 minutes after injection. Injection was tolerated well without issue. Last STI screening was in August and was negative. No need for further  kidney monitoring with Apretude. Will see him back in 2 months for injection, labs, and HIV PrEP follow up.  Plan: - Apretude injection administered - Next injection, labs, and PrEP follow up appointment scheduled for 07/22/21 - Call with any issues or questions   09/19/21, PharmD PGY2 Ambulatory Care Pharmacy Resident 05/24/2021 11:07 AM

## 2021-05-24 NOTE — Telephone Encounter (Signed)
[facility] Patient Advocate Encounter  Patient's medication (Apretude) have been couriered to [facility] from [employer] and will be administered on patient next office visit on 05/24/21.  Clearance Coots , CPhT Specialty Pharmacy Patient [facility] for Infect[facility] Disease Phone: [phone] Fax:  [phone]

## 2021-05-25 ENCOUNTER — Ambulatory Visit: Payer: Self-pay

## 2021-05-25 ENCOUNTER — Other Ambulatory Visit: Payer: Self-pay

## 2021-05-26 LAB — HIV-1 RNA QUANT-NO REFLEX-BLD
HIV 1 RNA Quant: NOT DETECTED Copies/mL
HIV-1 RNA Quant, Log: NOT DETECTED Log cps/mL

## 2021-06-01 ENCOUNTER — Other Ambulatory Visit: Payer: Self-pay

## 2021-06-01 ENCOUNTER — Ambulatory Visit: Payer: Self-pay

## 2021-06-01 NOTE — Progress Notes (Unsigned)
Patient ID: Michele Hodge, female   DOB: 14-Jun-1986, 35 y.o.   MRN: [id number]  Therapist met with client as scheduled and discussed treatment plan/case management progress. Therapist used active listening skills to provide client with opportunity to disclose previous challenges and successes.  The client & partner was assisted in identifying conflicts that could be addressed using communication, conflict-resolution, and/or problem-solving skills.  Client & partner was taught assertive communicating; offering positive, active listening; making positive requests of others for behavior change; and giving negative feedback in an honest, open, and respectful manner. Client & partner was reinforced for learning better communication skills. Therapist assessed for SI/HI during session and will follow-up with client during the next session.   Client met with therapist as scheduled. Client presented alert mood and appeared to euthymic; affect was congruent with client report of mood. Client continues to struggle making connections between consequences, challenges and alternative thoughts of mental health recovery during this session. Client is currently not a client, however, she is wanting to engage in couple's counseling with her partner. She and her partner are wanting to bring in another partner to engage in more sexual activity because she says her current boyfriend is unable to keep up with her drive. The partner that they want to bring in the relationship is also HIV positive who has been engaged in a sexual relationship with her boyfriend for the past 10 years. Client is taking prEP for prevention. Progress toward therapeutic goal(s) continues to remain minimal. Client denied SI/HI.

## 2021-06-01 NOTE — Progress Notes (Unsigned)
Patient ID: Michele Hodge, female   DOB: April 14, 1986, 35 y.o.   MRN: [id number]

## 2021-06-03 NOTE — Progress Notes (Unsigned)
Patient ID: Michele Hodge, female   DOB: 1986/04/23, 35 y.o.   MRN: [id number]  Therapist met with client as scheduled and discussed treatment plan/case management progress. Therapist used active listening skills to provide client with opportunity to disclose previous challenges and successes.  The client & partner was assisted in identifying conflicts that could be addressed using communication, conflict-resolution, and/or problem-solving skills.  Client & partner was taught assertive communicating; offering positive, active listening; making positive requests of others for behavior change; and giving negative feedback in an honest, open, and respectful manner. Client & partner was reinforced for learning better communication skills. Therapist assessed for SI/HI during session and will follow-up with client during the next session.   Client met with therapist as scheduled. Client presented alert mood and appeared to euthymic; affect was congruent with client report of mood. Client is able to make connections between consequences, challenges and alternative thoughts of mental health recovery during this session. Client is talking about her current progress in her relationship. She states that there has been more sex in her relationship. Client talked about her frustration with her sister and law and how she felt her sister & law was influencing her brother not to associate with her and the rest of the family. Client stated that she and her brother only had an hour this week to see each other; time that she missed due to a popped tire. Client stated that her brother will be coming down for Thanksgiving without his wife and she will be able to spend more time with him. Progress toward therapeutic goal(s) continues to remain minimal. Client denied SI/HI.

## 2021-06-03 NOTE — Progress Notes (Unsigned)
Patient ID: Michele Hodge, female   DOB: April 14, 1986, 35 y.o.   MRN: [id number]

## 2021-06-08 ENCOUNTER — Ambulatory Visit: Payer: Self-pay

## 2021-06-08 ENCOUNTER — Other Ambulatory Visit: Payer: Self-pay

## 2021-06-08 NOTE — Progress Notes (Unsigned)
Patient ID: Michele Hodge, female   DOB: April 14, 1986, 35 y.o.   MRN: [id number]

## 2021-06-08 NOTE — Progress Notes (Unsigned)
Patient ID: Michele Hodge, female   DOB: 09/02/1985, 35 y.o.   MRN: [id number]  Therapist met with client as scheduled and discussed treatment plan/case management progress. Therapist used active listening skills to provide client with opportunity to disclose previous challenges and successes.  The client & partner was assisted in identifying conflicts that could be addressed using communication, conflict-resolution, and/or problem-solving skills.  Client & partner was taught assertive communicating; offering positive, active listening; making positive requests of others for behavior change; and giving negative feedback in an honest, open, and respectful manner. Client & partner was reinforced for learning better communication skills. Therapist assessed for SI/HI during session and will follow-up with client during the next session.   Client met with therapist as scheduled. Client presented alert mood and appeared to euthymic; affect was congruent with client report of mood. Client is able to make connections between consequences, challenges and alternative thoughts of mental health recovery during this session. Client is talking about her current progress in her relationship. Client talked about her boyfriend and her dad forming a bond and cooking together. Client and her boyfriend engaged in 3-way sex this past weekend with their chosen partner (who states that he does not want anything outside a causal relationship). Client stated that she was disappointed but felt there was a strong connection when they had sex. Client talked about some of her jealous feeling between both her partner and herself. Client stated that she felt jealous when her boyfriend seemed more aroused to have sex with their casual partner (Jay who is female) than her. And her boyfriend became jealous when he woke and found her and Jay together having sex alone.  Progress toward therapeutic goal(s) continues to remain minimal. Client denied  SI/HI.  

## 2021-06-15 ENCOUNTER — Other Ambulatory Visit: Payer: Self-pay

## 2021-06-15 ENCOUNTER — Ambulatory Visit: Payer: Self-pay

## 2021-06-17 NOTE — Progress Notes (Unsigned)
Therapist met with client as scheduled and discussed treatment plan/case management progress. Therapist used active listening skills to provide client with opportunity to disclose previous challenges and successes.  The client & partner was assisted in identifying conflicts that could be addressed using communication, conflict-resolution, and/or problem-solving skills.  Client & partner was taught assertive communicating; offering positive, active listening; making positive requests of others for behavior change; and giving negative feedback in an honest, open, and respectful manner. Client & partner was reinforced for learning better communication skills. Therapist assessed for SI/HI during session and will follow-up with client during the next session.   Client met with therapist as scheduled. Client presented alert mood and appeared to euthymic; affect was congruent with client report of mood. Client continues to struggle making connections between consequences, challenges and alternative thoughts of mental health recovery during this session. Client talked about some of the dynamics in her interpersonal relationships. Client informed clinician that she and her partner are engaging in arguing less often and her boyfriend has formed an unlikely bond with her father. Client reports that she and her boyfriend have engaged in 3-ways sex with her boyfriend's longtime female partner. Client states that she enjoyed the sex with them and may be adding another partner into the relationship. Client also informed clinician that they plan to move in her and her boyfriends' female partner within the next two weeks. Progress toward therapeutic goal(s) continues to remain minimal. Client dejmmnied SI/HI.

## 2021-06-22 ENCOUNTER — Ambulatory Visit: Payer: Self-pay

## 2021-06-22 ENCOUNTER — Other Ambulatory Visit: Payer: Self-pay

## 2021-06-22 NOTE — Progress Notes (Unsigned)
Therapist met with client as scheduled and discussed treatment plan/case management progress. Therapist used active listening skills to provide client with opportunity to disclose previous challenges and successes.  The client & partner was assisted in identifying conflicts that could be addressed using communication, conflict-resolution, and/or problem-solving skills.  Client & partner was taught assertive communicating; offering positive, active listening; making positive requests of others for behavior change; and giving negative feedback in an honest, open, and respectful manner. Client & partner was reinforced for learning better communication skills. Therapist assessed for SI/HI during session and will follow-up with client during the next session.   Client met with therapist as scheduled. Client presented alert mood and appeared to euthymic; affect was congruent with client report of mood. Client continues to struggle making connections between consequences, challenges and alternative thoughts of mental health recovery during this session. Client talked about some of the dynamics in her interpersonal relationships. Client informed clinician that she and her boyfriend have moved in their partner as a roommate to assist with the bills. Client stated that her boyfriend became upset when he found that she and the roommate had been having sex without him. Progress toward therapeutic goal(s) continues to remain minimal. Client denied SI/HI.

## 2021-07-01 ENCOUNTER — Ambulatory Visit: Payer: Self-pay

## 2021-07-06 ENCOUNTER — Ambulatory Visit: Payer: Self-pay

## 2021-07-06 ENCOUNTER — Other Ambulatory Visit: Payer: Self-pay

## 2021-07-08 ENCOUNTER — Telehealth: Payer: Self-pay

## 2021-07-08 NOTE — Telephone Encounter (Signed)
[facility] Patient Advocate Encounter  Patient's medication (Apretude) have been couriered to [facility] from [facility] and will be administered on the patient next office visit on 07/22/21.  Clearance Coots , CPhT Specialty Pharmacy Patient [facility] for Infectious Disease Phone: [phone] Fax:  [phone]

## 2021-07-13 ENCOUNTER — Ambulatory Visit: Payer: Self-pay

## 2021-07-15 NOTE — Progress Notes (Unsigned)
Therapist met with client as scheduled and discussed treatment plan/case management progress. Therapist used active listening skills to provide client with opportunity to disclose previous challenges and successes.  The client & partner was assisted in identifying conflicts that could be addressed using communication, conflict-resolution, and/or problem-solving skills.  Client & partner was taught assertive communicating; offering positive, active listening; making positive requests of others for behavior change; and giving negative feedback in an honest, open, and respectful manner. Client & partner was reinforced for learning better communication skills. Therapist assessed for SI/HI during session and will follow-up with client during the next session.   Client met with therapist as scheduled. Client presented alert mood and appeared to euthymic; affect was congruent with client report of mood. Client was able to make connections between consequences, challenges and alternative thoughts of mental health recovery during this session. Client talked about her success and challenges with her polygamous relationship. She stated that her 2nd boyfriend told her that even though he enjoys having sex with her, he prefers to have sex with African American women. Client states that she took no offense to her but will continue to have sex with him. Client states that she is more satisfied having sex with two men at this time rather than one.  Progress toward therapeutic goal(s) continues to remain minimal. Client denied SI/HI.

## 2021-07-20 ENCOUNTER — Other Ambulatory Visit: Payer: Self-pay

## 2021-07-20 ENCOUNTER — Ambulatory Visit: Payer: Self-pay

## 2021-07-22 ENCOUNTER — Ambulatory Visit: Payer: Self-pay | Admitting: Pharmacist

## 2021-07-22 ENCOUNTER — Other Ambulatory Visit: Payer: Self-pay

## 2021-07-22 NOTE — Progress Notes (Unsigned)
Therapist met with client as scheduled and discussed treatment plan/case management progress. Therapist used active listening skills to provide client with opportunity to disclose previous challenges and successes. Therapist assessed for SI/HI during session and will follow-up with client during the next session.  Client met with therapist as scheduled. Client presented alert mood and appeared to euthymic; affect was congruent with client report of mood. Client was able to make connections between consequences, challenges and alternative thoughts of mental health recovery during this session. Client continues to express their needs in the development of treatment goals and described different communities of which can serve an added support for them. Client expressed concerns about different problematic areas in their life. Client talked about her family dynamics and the interpersonal issues thereof. Client stated that there is a growing problem with her family coming together during the holidays and/or otherwise. Client also talked briefly about her relationship with her boyfriend and how they are currently re-evaluating their goals and how they seem not to align as a couple. Progress toward therapeutic goal(s) continues to remain minimal at this time. Client denied SI/HI.

## 2021-07-27 ENCOUNTER — Ambulatory Visit: Payer: Self-pay

## 2021-07-27 ENCOUNTER — Other Ambulatory Visit: Payer: Self-pay

## 2021-07-29 ENCOUNTER — Other Ambulatory Visit: Payer: Self-pay

## 2021-07-29 ENCOUNTER — Ambulatory Visit (INDEPENDENT_AMBULATORY_CARE_PROVIDER_SITE_OTHER): Payer: Self-pay | Admitting: Pharmacist

## 2021-07-29 ENCOUNTER — Other Ambulatory Visit (HOSPITAL_COMMUNITY): Payer: Self-pay

## 2021-07-29 ENCOUNTER — Telehealth: Payer: Self-pay

## 2021-07-29 DIAGNOSIS — Z79899 Other long term (current) drug therapy: Secondary | ICD-10-CM

## 2021-07-29 DIAGNOSIS — Z23 Encounter for immunization: Secondary | ICD-10-CM

## 2021-07-29 MED ORDER — CABOTEGRAVIR ER 600 MG/3ML IM SUER
600.0000 mg | Freq: Once | INTRAMUSCULAR | Status: AC
  Administered 2021-07-29: 600 mg via INTRAMUSCULAR

## 2021-07-29 NOTE — Progress Notes (Unsigned)
Therapist met with client as scheduled and discussed treatment plan/case management progress. Therapist used active listening skills to provide client with opportunity to disclose previous challenges and successes.  The client & partner was assisted in identifying conflicts that could be addressed using communication, conflict-resolution, and/or problem-solving skills.  Client & partner was taught assertive communicating; offering positive, active listening; making positive requests of others for behavior change; and giving negative feedback in an honest, open, and respectful manner. Client & partner was reinforced for learning better communication skills. Therapist assessed for SI/HI during session and will follow-up with client during the next session.   Client met with therapist as scheduled. Client presented alert mood and appeared to euthymic; affect was congruent with client report of mood. Client was able to make connections between consequences, challenges and alternative thoughts of mental health recovery during this session. Client talked about her success and challenges with her polygamous relationship. She stated that she is no longer having sex with her 2nd boyfriend and now wants him to leave. Client reports that she does not like the fact that he is smoking weed in the home. Client talked about doing a parasite cleanse because she believed she had pin worms due to an itchy anus. Client stated that she is not sleeping with her second roommate due to the fact that he has HIV and has not been current on his medications and she has not gone to get her pREP. Client and clinician processed the importance of safety. Progress toward therapeutic goal(s) continues to remain minimal. Client denied SI/HI.

## 2021-07-29 NOTE — Progress Notes (Unsigned)
hiv

## 2021-07-29 NOTE — Telephone Encounter (Signed)
[facility] Patient Advocate Encounter  Apretude is covered under patients pharmacy benefits.  Prescription can be filled at [facility].  Copay $3220.25  I enrolled the patient in [employer] Portal Claims.      Clearance Coots, CPhT Specialty Pharmacy Patient [facility] for Infectious Disease Phone: [phone] Fax:  [phone]

## 2021-07-29 NOTE — Progress Notes (Deleted)
HPI: Michele Hodge is a 36 y.o. female who presents to the [facility] pharmacy clinic for Apretude administration and HIV PrEP follow up.  Patient Active Problem List   Diagnosis Date Noted   S/P laparoscopic cholecystectomy 03/15/2018   Gastroesophageal reflux disease 04/17/2017   Overweight (BMI 25.0-29.9) 12/26/2016    Patient's Medications  New Prescriptions   No medications on file  Previous Medications   AMPHETAMINE-DEXTROAMPHETAMINE (ADDERALL) 10 MG TABLET    Take 10 mg by mouth every morning.   ATOMOXETINE (STRATTERA) 60 MG CAPSULE    Take 60 mg by mouth daily.   CABOTEGRAVIR ER (APRETUDE) 600 MG/3ML INJECTION    Inject 3 mLs (600 mg total) into the muscle every 2 (two) months.   LEVONORGESTREL (MIRENA) 20 MCG/DAY IUD    by Intrauterine route.  Modified Medications   No medications on file  Discontinued Medications   No medications on file    Allergies: No Known Allergies  Past Medical History: Past Medical History:  Diagnosis Date   Healthy adult on routine physical examination     Social History: Social History   Socioeconomic History   Marital status: Single    Spouse name: Not on file   Number of children: Not on file   Years of education: Not on file   Highest education level: Not on file  Occupational History   Not on file  Tobacco Use   Smoking status: Never   Smokeless tobacco: Never  Vaping Use   Vaping Use: Never used  Substance and Sexual Activity   Alcohol use: Yes    Alcohol/week: 1.0 standard drink    Types: 1 Glasses of wine per week    Comment: occasional use   Drug use: Yes    Types: Marijuana   Sexual activity: Never  Other Topics Concern   Not on file  Social History Narrative   Not on file   Social Determinants of Health   Financial Resource Strain: Not on file  Food Insecurity: Not on file  Transportation Needs: Not on file  Physical Activity: Not on file  Stress: Not on file  Social Connections: Not on file     Labs: Lab Results  Component Value Date   HIV1RNAQUANT Not Detected 05/24/2021   HIV1RNAQUANT Not Detected 03/30/2021   HIV1RNAQUANT Not Detected 02/24/2021    RPR and STI Lab Results  Component Value Date   LABRPR NON-REACTIVE 02/17/2021   LABRPR NON-REACTIVE 12/23/2020   LABRPR NON-REACTIVE 02/06/2019   LABRPR Non Reactive 04/21/2017   LABRPR NON REAC 12/26/2016    STI Results GC GC CT CT  02/17/2021 Negative - Negative -  12/22/2020 Negative - Negative -  02/05/2019 Negative - Negative -  04/17/2017 - - Negative -  12/26/2016 - NOT DETECTED - NOT DETECTED    Hepatitis B Lab Results  Component Value Date   HEPBSAB REACTIVE (A) 02/17/2021   HEPBSAG NON-REACTIVE 02/17/2021   Hepatitis C Lab Results  Component Value Date   HEPCAB NON-REACTIVE 02/17/2021   Hepatitis A Lab Results  Component Value Date   HAV REACTIVE (A) 02/17/2021   Lipids: Lab Results  Component Value Date   CHOL 190 02/06/2019   TRIG 258 (H) 02/06/2019   HDL 51 02/06/2019   CHOLHDL 3.7 02/06/2019   VLDL 33 (H) 12/26/2016   LDLCALC 102 (H) 02/06/2019    TARGET DATE: The 10th of the month  Assessment: Michele Hodge presents today for their Apretude injection and to follow up for HIV PrEP.  No issues with past injections. No known exposures to any STIs and no signs or symptoms of any STIs today. Last STI screening was 02/2021 and was negative. Screened patient for acute HIV symptoms such as fatigue, muscle aches, rash, sore throat, lymphadenopathy, headache, night sweats, nausea/vomiting/diarrhea, and fever. Patient denies any symptoms. No new partners since last injection. HIV antibody blood test was drawn immediately prior to injection and was negative. Patient also had HIV RNA drawn today. Patient also due for flu shot, will administer today.  Administered cabotegravir 600mg /83mL in left upper outer quadrant of the gluteal muscle. Monitored patient for 10 minutes after injection. Injection was tolerated  well without issue. Will see her back in 2 months for injection, labs, and HIV PrEP follow up.  Plan: - Apretude injection administered - Flu shot administered - HIV RNA today - Next injection, labs, and PrEP follow up appointment scheduled for 09/27/21 - Call with any issues or questions  09/29/21) Ancelmo Hunt, PharmD Student

## 2021-08-01 LAB — HIV-1 RNA QUANT-NO REFLEX-BLD
HIV 1 RNA Quant: NOT DETECTED Copies/mL
HIV-1 RNA Quant, Log: NOT DETECTED Log cps/mL

## 2021-08-02 NOTE — Progress Notes (Signed)
HPI: Michele Hodge is a 36 y.o. female who presents to the [facility] pharmacy clinic for Apretude administration and HIV PrEP follow up.  Patient Active Problem List   Diagnosis Date Noted   S/P laparoscopic cholecystectomy 03/15/2018   Gastroesophageal reflux disease 04/17/2017   Overweight (BMI 25.0-29.9) 12/26/2016    Patient's Medications  New Prescriptions   No medications on file  Previous Medications   AMPHETAMINE-DEXTROAMPHETAMINE (ADDERALL) 10 MG TABLET    Take 10 mg by mouth every morning.   ATOMOXETINE (STRATTERA) 60 MG CAPSULE    Take 60 mg by mouth daily.   CABOTEGRAVIR ER (APRETUDE) 600 MG/3ML INJECTION    Inject 3 mLs (600 mg total) into the muscle every 2 (two) months.   LEVONORGESTREL (MIRENA) 20 MCG/DAY IUD    by Intrauterine route.  Modified Medications   No medications on file  Discontinued Medications   No medications on file    Allergies: No Known Allergies  Past Medical History: Past Medical History:  Diagnosis Date   Healthy adult on routine physical examination     Social History: Social History   Socioeconomic History   Marital status: Single    Spouse name: Not on file   Number of children: Not on file   Years of education: Not on file   Highest education level: Not on file  Occupational History   Not on file  Tobacco Use   Smoking status: Never   Smokeless tobacco: Never  Vaping Use   Vaping Use: Never used  Substance and Sexual Activity   Alcohol use: Yes    Alcohol/week: 1.0 standard drink    Types: 1 Glasses of wine per week    Comment: occasional use   Drug use: Yes    Types: Marijuana   Sexual activity: Never  Other Topics Concern   Not on file  Social History Narrative   Not on file   Social Determinants of Health   Financial Resource Strain: Not on file  Food Insecurity: Not on file  Transportation Needs: Not on file  Physical Activity: Not on file  Stress: Not on file  Social Connections: Not on file     Labs: Lab Results  Component Value Date   HIV1RNAQUANT Not Detected 07/29/2021   HIV1RNAQUANT Not Detected 05/24/2021   HIV1RNAQUANT Not Detected 03/30/2021    RPR and STI Lab Results  Component Value Date   LABRPR NON-REACTIVE 02/17/2021   LABRPR NON-REACTIVE 12/23/2020   LABRPR NON-REACTIVE 02/06/2019   LABRPR Non Reactive 04/21/2017   LABRPR NON REAC 12/26/2016    STI Results GC GC CT CT  02/17/2021 Negative - Negative -  12/22/2020 Negative - Negative -  02/05/2019 Negative - Negative -  04/17/2017 - - Negative -  12/26/2016 - NOT DETECTED - NOT DETECTED    Hepatitis B Lab Results  Component Value Date   HEPBSAB REACTIVE (A) 02/17/2021   HEPBSAG NON-REACTIVE 02/17/2021   Hepatitis C Lab Results  Component Value Date   HEPCAB NON-REACTIVE 02/17/2021   Hepatitis A Lab Results  Component Value Date   HAV REACTIVE (A) 02/17/2021   Lipids: Lab Results  Component Value Date   CHOL 190 02/06/2019   TRIG 258 (H) 02/06/2019   HDL 51 02/06/2019   CHOLHDL 3.7 02/06/2019   VLDL 33 (H) 12/26/2016   LDLCALC 102 (H) 02/06/2019    TARGET DATE: The 10th of the month  Assessment: Michele Hodge presents today for their Apretude injection and to follow up for HIV PrEP.  No issues with past injections. No known exposures to any STIs and no signs or symptoms of any STIs today. Last STI screening was 02/2021 and was negative. Screened patient for acute HIV symptoms such as fatigue, muscle aches, rash, sore throat, lymphadenopathy, headache, night sweats, nausea/vomiting/diarrhea, and fever. Patient denies any symptoms. No new partners since last injection. HIV antibody blood test was drawn immediately prior to injection and was negative. Patient also had HIV RNA drawn today.   Patient reported concern for parasite infection due to rectal itching and took an at home parasite cleanse treatment. Requested information on treatment options to prevent transmission, however counseled patient  that she would require a diagnosis and a referral from PCP to be seen here.  Administered cabotegravir 600mg /68mL in left upper outer quadrant of the gluteal muscle. Monitored patient for 10 minutes after injection. Injection was tolerated well without issue. Will see her back in 2 months for injection, labs, and HIV PrEP follow up.  Plan: - Apretude injection administered - Flu shot administered - HIV RNA today - Next injection, labs, and PrEP follow up appointment scheduled for 09/27/21 - Call with any issues or questions  09/29/21) Natesha Hassey, PharmD Student

## 2021-08-03 ENCOUNTER — Ambulatory Visit: Payer: Self-pay

## 2021-08-10 ENCOUNTER — Ambulatory Visit: Payer: Self-pay

## 2021-08-16 ENCOUNTER — Telehealth: Payer: Self-pay

## 2021-08-16 NOTE — Telephone Encounter (Signed)
[facility] Patient Advocate Encounter  Apretude is covered under patients [employer] Medical Benefits.  Patient will have a $10.00 office copay   Patient is enrolled in [employer] Claims  Partner Patient ID : [id number] Member ID : [id number] Group Number : [id number] BIN : [id number] PCN : [id number] Patient Name : Michele Hodge Enrollment Date :07/30/2021      Clearance Coots, CPhT [facility] Patient Advocate [facility] Phone: [phone] Fax:  [phone]

## 2021-08-17 ENCOUNTER — Ambulatory Visit: Payer: Self-pay

## 2021-08-24 ENCOUNTER — Ambulatory Visit: Payer: Self-pay

## 2021-08-31 ENCOUNTER — Ambulatory Visit: Payer: Self-pay

## 2021-09-07 ENCOUNTER — Ambulatory Visit: Payer: Self-pay

## 2021-09-14 ENCOUNTER — Ambulatory Visit: Payer: Self-pay

## 2021-09-16 ENCOUNTER — Other Ambulatory Visit: Payer: Self-pay

## 2021-09-16 ENCOUNTER — Encounter: Payer: Self-pay | Admitting: Internal Medicine

## 2021-09-16 ENCOUNTER — Other Ambulatory Visit (HOSPITAL_COMMUNITY)
Admission: RE | Admit: 2021-09-16 | Discharge: 2021-09-16 | Disposition: A | Discharge status: Home / Self Care | Payer: 59 | Source: Ambulatory Visit | Attending: Internal Medicine | Admitting: Internal Medicine

## 2021-09-16 ENCOUNTER — Ambulatory Visit (INDEPENDENT_AMBULATORY_CARE_PROVIDER_SITE_OTHER): Payer: 59 | Admitting: Internal Medicine

## 2021-09-16 VITALS — BP 101/83 | HR 94 | Temp 97.1°F | Wt 220.0 lb

## 2021-09-16 DIAGNOSIS — Z0001 Encounter for general adult medical examination with abnormal findings: Secondary | ICD-10-CM

## 2021-09-16 DIAGNOSIS — Z113 Encounter for screening for infections with a predominantly sexual mode of transmission: Secondary | ICD-10-CM

## 2021-09-16 DIAGNOSIS — R6889 Other general symptoms and signs: Secondary | ICD-10-CM | POA: Diagnosis not present

## 2021-09-16 DIAGNOSIS — R5383 Other fatigue: Secondary | ICD-10-CM

## 2021-09-16 DIAGNOSIS — E66811 Other obesity due to excess calories: Secondary | ICD-10-CM

## 2021-09-16 DIAGNOSIS — Z6830 Body mass index (BMI) 30.0-30.9, adult: Secondary | ICD-10-CM

## 2021-09-16 DIAGNOSIS — N926 Irregular menstruation, unspecified: Secondary | ICD-10-CM

## 2021-09-16 DIAGNOSIS — E6609 Other obesity due to excess calories: Secondary | ICD-10-CM | POA: Diagnosis not present

## 2021-09-16 DIAGNOSIS — E782 Mixed hyperlipidemia: Secondary | ICD-10-CM

## 2021-09-16 DIAGNOSIS — E010 Iodine-deficiency related diffuse (endemic) goiter: Secondary | ICD-10-CM

## 2021-09-16 DIAGNOSIS — F909 Attention-deficit hyperactivity disorder, unspecified type: Secondary | ICD-10-CM | POA: Insufficient documentation

## 2021-09-16 DIAGNOSIS — Z7251 High risk heterosexual behavior: Secondary | ICD-10-CM | POA: Insufficient documentation

## 2021-09-16 DIAGNOSIS — Z975 Presence of (intrauterine) contraceptive device: Secondary | ICD-10-CM

## 2021-09-16 NOTE — Addendum Note (Signed)
Addended by: Kavin Leech E on: 09/16/2021 03:05 PM ? ? Modules accepted: Orders ? ?

## 2021-09-16 NOTE — Progress Notes (Signed)
? ?Subjective:  ? ? Patient ID: Michele Hodge, female    DOB: September 11, 1985, 36 y.o.   MRN: [id number] ? ?HPI ? ?Pt presents to the clinic today for her annual exam. ? ?Flu: 07/2021 ?Tetanus: 01/2019 ?Covid: Pfizer x 3 ?Pap smear: 01/2019. ?Dentist: as needed ? ?Diet: She does eat meat. She consumes fruits and veggies. She does eat some fried foods. She drinks mostly water, soda. ?Exercise: 1-2 x week, full body workout at gym ? ?Review of Systems ? ?   ?Past Medical History:  ?Diagnosis Date  ? Healthy adult on routine physical examination   ? ? ?Current Outpatient Medications  ?Medication Sig Dispense Refill  ? amphetamine-dextroamphetamine (ADDERALL) 10 MG tablet Take 10 mg by mouth every morning.    ? atomoxetine (STRATTERA) 60 MG capsule Take 60 mg by mouth daily.    ? cabotegravir ER (APRETUDE) 600 MG/3ML injection Inject 3 mLs (600 mg total) into the muscle every 2 (two) months. 3 mL 5  ? levonorgestrel (MIRENA) 20 MCG/DAY IUD by Intrauterine route.    ? ?No current facility-administered medications for this visit.  ? ? ?No Known Allergies ? ?Family History  ?Problem Relation Age of Onset  ? Diabetes Father 56  ? Stroke Maternal Grandmother   ? CAD Maternal Grandfather   ? Stroke Paternal Grandmother   ? Schizophrenia Brother 20  ? Breast cancer Neg Hx   ? Colon cancer Neg Hx   ? Ovarian cancer Neg Hx   ? ? ?Social History  ? ?Socioeconomic History  ? Marital status: Single  ?  Spouse name: Not on file  ? Number of children: Not on file  ? Years of education: Not on file  ? Highest education level: Not on file  ?Occupational History  ? Not on file  ?Tobacco Use  ? Smoking status: Never  ? Smokeless tobacco: Never  ?Vaping Use  ? Vaping Use: Never used  ?Substance and Sexual Activity  ? Alcohol use: Yes  ?  Alcohol/week: 1.0 standard drink  ?  Types: 1 Glasses of wine per week  ?  Comment: occasional use  ? Drug use: Yes  ?  Types: Marijuana  ? Sexual activity: Never  ?Other Topics Concern  ? Not on file  ?Social  History Narrative  ? Not on file  ? ?Social Determinants of Health  ? ?Financial Resource Strain: Not on file  ?Food Insecurity: Not on file  ?Transportation Needs: Not on file  ?Physical Activity: Not on file  ?Stress: Not on file  ?Social Connections: Not on file  ?Intimate Partner Violence: Not on file  ? ? ? ?Constitutional: Patient reports fatigue.  Denies fever, malaise, headache or abrupt weight changes.  ?HEENT: Denies eye pain, eye redness, ear pain, ringing in the ears, wax buildup, runny nose, nasal congestion, bloody nose, or sore throat. ?Respiratory: Denies difficulty breathing, shortness of breath, cough or sputum production.   ?Cardiovascular: Denies chest pain, chest tightness, palpitations or swelling in the hands or feet.  ?Gastrointestinal: Patient reports intermittent reflux.  Denies abdominal pain, bloating, constipation, diarrhea or blood in the stool.  ?GU: Patient reports irregular menses.  Denies urgency, frequency, pain with urination, burning sensation, blood in urine, odor or discharge. ?Musculoskeletal: Denies decrease in range of motion, difficulty with gait, muscle pain or joint pain and swelling.  ?Skin: Patient reports cold intolerance.  Denies redness, rashes, lesions or ulcercations.  ?Neurological: Patient reports inattention and difficulty focusing.  Denies dizziness, difficulty with speech or problems  with balance and coordination.  ?Psych: Denies anxiety, depression, SI/HI. ? ?No other specific complaints in a complete review of systems (except as listed in HPI above). ? ?Objective:  ? Physical Exam ? ?BP 101/83 (BP Location: Left Arm, Patient Position: Sitting, Cuff Size: Large)   Pulse 94   Temp (!) 97.1 ?F (36.2 ?C) (Temporal)   Wt 220 lb (99.8 kg)   SpO2 97%   BMI 30.68 kg/m?  ? ?Wt Readings from Last 3 Encounters:  ?12/22/20 225 lb 12.8 oz (102.4 kg)  ?02/05/19 227 lb 12.8 oz (103.3 kg)  ?03/15/18 204 lb 6.4 oz (92.7 kg)  ? ? ?General: Appears her stated age, obese,  in NAD. ?Skin: Warm, dry and intact.  ?HEENT: Head: normal shape and size; Eyes: sclera white and EOMs intact;  ?Neck:  Neck supple, trachea midline. No masses, lumps present.  Thyromegaly noted. ?Cardiovascular: Normal rate and rhythm. S1,S2 noted.  No murmur, rubs or gallops noted. No JVD or BLE edema.  ?Pulmonary/Chest: Normal effort and positive vesicular breath sounds. No respiratory distress. No wheezes, rales or ronchi noted.  ?Abdomen: Soft and nontender. Normal bowel sounds.  ?Pelvic: Normal female anatomy.  IUD strings noted coming from the cervical os.  Small amount of yellow discharge noted from the cervical os, no odor.  Large amount of blood in the vaginal vault as she is currently on her menses. ?Musculoskeletal: Strength 5/5 BUE/BLE.  No difficulty with gait.  ?Neurological: Alert and oriented. Cranial nerves II-XII grossly intact. Coordination normal.  ?Psychiatric: Mood and affect normal. Behavior is normal. Judgment and thought content normal.  ? ? ? ?BMET ?   ?Component Value Date/Time  ? NA 139 02/17/2021 1500  ? K 4.5 02/17/2021 1500  ? CL 103 02/17/2021 1500  ? CO2 26 02/17/2021 1500  ? GLUCOSE 87 02/17/2021 1500  ? BUN 10 02/17/2021 1500  ? CREATININE 0.81 02/17/2021 1500  ? CALCIUM 9.2 02/17/2021 1500  ? GFRNONAA 91 02/06/2019 0853  ? GFRAA 106 02/06/2019 0853  ? ? ?Lipid Panel  ?   ?Component Value Date/Time  ? CHOL 190 02/06/2019 0853  ? TRIG 258 (H) 02/06/2019 0853  ? HDL 51 02/06/2019 0853  ? CHOLHDL 3.7 02/06/2019 0853  ? VLDL 33 (H) 12/26/2016 0909  ? LDLCALC 102 (H) 02/06/2019 0853  ? ? ?CBC ?   ?Component Value Date/Time  ? WBC 8.5 02/06/2019 0853  ? RBC 4.38 02/06/2019 0853  ? HGB 13.5 02/06/2019 0853  ? HCT 40.6 02/06/2019 0853  ? PLT 300 02/06/2019 0853  ? MCV 92.7 02/06/2019 0853  ? MCH 30.8 02/06/2019 0853  ? MCHC 33.3 02/06/2019 0853  ? RDW 11.9 02/06/2019 0853  ? LYMPHSABS 3,434 02/06/2019 0853  ? MONOABS 0.5 12/29/2017 0308  ? EOSABS 111 02/06/2019 0853  ? BASOSABS 68  02/06/2019 0853  ? ? ?Hgb A1C ?Lab Results  ?Component Value Date  ? HGBA1C 5.4 02/06/2019  ? ? ? ? ? ? ?   ?Assessment & Plan:  ? ?Preventative Health Maintenance: ? ?Flu shot UTD ?Tetanus UTD ?Encouraged her to get her COVID booster ?Pelvic exam with screen for STD, she is not due for her Pap smear ?Encouraged her to consume a balanced diet and exercise regimen ?Advised her to see an eye doctor and dentist annually ?We will check CBC, c-Met, TSH, lipid, A1c, iron ? ?Thyromegaly, Cold Intolerance, Fatigue and Irregular Menses: ? ?We will check TSH and IBC panel ? ?IUD check: ? ?We will obtain pelvic ultrasound  to check for placement ? ?RTC in 6 months, follow-up chronic conditions ? ?Webb Silversmith, NP ?This visit occurred during the SARS-CoV-2 public health emergency.  Safety protocols were in place, including screening questions prior to the visit, additional usage of staff PPE, and extensive cleaning of exam room while observing appropriate contact time as indicated for disinfecting solutions.  ? ?

## 2021-09-16 NOTE — Patient Instructions (Signed)

## 2021-09-16 NOTE — Assessment & Plan Note (Signed)
Encouraged low carb diet and exercise for weight loss 

## 2021-09-17 DIAGNOSIS — E782 Mixed hyperlipidemia: Secondary | ICD-10-CM | POA: Insufficient documentation

## 2021-09-17 LAB — CBC
HCT: 43.2 % (ref 35.0–45.0)
Hemoglobin: 14.7 g/dL (ref 11.7–15.5)
MCH: 30.4 pg (ref 27.0–33.0)
MCHC: 34 g/dL (ref 32.0–36.0)
MCV: 89.3 fL (ref 80.0–100.0)
MPV: 9.8 fL (ref 7.5–12.5)
Platelets: 321 10*3/uL (ref 140–400)
RBC: 4.84 10*6/uL (ref 3.80–5.10)
RDW: 12.2 % (ref 11.0–15.0)
WBC: 6.6 10*3/uL (ref 3.8–10.8)

## 2021-09-17 LAB — LIPID PANEL
Cholesterol: 220 mg/dL — ABNORMAL HIGH (ref <200)
HDL: 45 mg/dL — ABNORMAL LOW (ref >=50)
LDL Cholesterol (Calc): 137 mg/dL (calc) — ABNORMAL HIGH
Non-HDL Cholesterol (Calc): 175 mg/dL (calc) — ABNORMAL HIGH (ref <130)
Total CHOL/HDL Ratio: 4.9 (calc) (ref <5.0)
Triglycerides: 248 mg/dL — ABNORMAL HIGH (ref <150)

## 2021-09-17 LAB — COMPLETE METABOLIC PANEL WITH GFR
AG Ratio: 1.8 (calc) (ref 1.0–2.5)
ALT: 28 U/L (ref 6–29)
AST: 20 U/L (ref 10–30)
Albumin: 4.5 g/dL (ref 3.6–5.1)
Alkaline phosphatase (APISO): 44 U/L (ref 31–125)
BUN: 10 mg/dL (ref 7–25)
CO2: 22 mmol/L (ref 20–32)
Calcium: 9.5 mg/dL (ref 8.6–10.2)
Chloride: 107 mmol/L (ref 98–110)
Creat: 0.82 mg/dL (ref 0.50–0.97)
Globulin: 2.5 g/dL (calc) (ref 1.9–3.7)
Glucose, Bld: 125 mg/dL — ABNORMAL HIGH (ref 65–99)
Potassium: 3.9 mmol/L (ref 3.5–5.3)
Sodium: 139 mmol/L (ref 135–146)
Total Bilirubin: 0.7 mg/dL (ref 0.2–1.2)
Total Protein: 7 g/dL (ref 6.1–8.1)
eGFR: 96 mL/min/{1.73_m2} (ref >=60)

## 2021-09-17 LAB — IRON,TIBC AND FERRITIN PANEL
%SAT: 34 % (calc) (ref 16–45)
Ferritin: 59 ng/mL (ref 16–154)
Iron: 129 ug/dL (ref 40–190)
TIBC: 379 mcg/dL (calc) (ref 250–450)

## 2021-09-17 LAB — TSH: TSH: 1.3 mIU/L

## 2021-09-17 LAB — HEMOGLOBIN A1C
Hgb A1c MFr Bld: 5.7 % of total Hgb — ABNORMAL HIGH (ref <5.7)
Mean Plasma Glucose: 117 mg/dL
eAG (mmol/L): 6.5 mmol/L

## 2021-09-17 MED ORDER — ATORVASTATIN CALCIUM 10 MG PO TABS: 10.0000 mg | ORAL_TABLET | Freq: Every day | ORAL | 2 refills | Status: DC

## 2021-09-17 NOTE — Addendum Note (Signed)
Addended by: Lorre Munroe on: 09/17/2021 04:45 PM ? ? Modules accepted: Orders ? ?

## 2021-09-20 LAB — CERVICOVAGINAL ANCILLARY ONLY
Bacterial Vaginitis (gardnerella): POSITIVE — AB
Candida Glabrata: NEGATIVE
Candida Vaginitis: NEGATIVE
Chlamydia: NEGATIVE
Comment: NEGATIVE
Comment: NEGATIVE
Comment: NEGATIVE
Comment: NEGATIVE
Comment: NEGATIVE
Comment: NORMAL
Neisseria Gonorrhea: NEGATIVE
Trichomonas: NEGATIVE

## 2021-09-22 ENCOUNTER — Ambulatory Visit
Admission: RE | Admit: 2021-09-22 | Discharge: 2021-09-22 | Disposition: A | Discharge status: Home / Self Care | Payer: 59 | Source: Ambulatory Visit | Attending: Internal Medicine | Admitting: Internal Medicine

## 2021-09-22 DIAGNOSIS — N926 Irregular menstruation, unspecified: Secondary | ICD-10-CM | POA: Diagnosis not present

## 2021-09-22 DIAGNOSIS — Z975 Presence of (intrauterine) contraceptive device: Secondary | ICD-10-CM | POA: Diagnosis not present

## 2021-09-27 ENCOUNTER — Other Ambulatory Visit: Payer: 59

## 2021-09-27 ENCOUNTER — Other Ambulatory Visit: Payer: Self-pay

## 2021-09-27 ENCOUNTER — Ambulatory Visit (INDEPENDENT_AMBULATORY_CARE_PROVIDER_SITE_OTHER): Payer: 59 | Admitting: Pharmacist

## 2021-09-27 DIAGNOSIS — Z79899 Other long term (current) drug therapy: Secondary | ICD-10-CM

## 2021-09-27 MED ORDER — CABOTEGRAVIR ER 600 MG/3ML IM SUER
600.0000 mg | Freq: Once | INTRAMUSCULAR | Status: AC
  Administered 2021-09-27: 600 mg via INTRAMUSCULAR

## 2021-09-27 NOTE — Progress Notes (Addendum)
? ?HPI: Michele Hodge is a 36 y.o. female who presents to the [facility] for Apretude administration and HIV PrEP follow up. ? ?Patient Active Problem List  ? Diagnosis Date Noted  ? Mixed hyperlipidemia 09/17/2021  ? Class 1 obesity due to excess calories with body mass index (BMI) of 30.0 to 30.9 in adult 09/16/2021  ? ADHD 09/16/2021  ? High risk sexual behavior 09/16/2021  ? Gastroesophageal reflux disease 04/17/2017  ? ? ?Patient's Medications  ?New Prescriptions  ? No medications on file  ?Previous Medications  ? AMPHETAMINE-DEXTROAMPHETAMINE (ADDERALL) 10 MG TABLET    Take 10 mg by mouth every morning.  ? ATOMOXETINE (STRATTERA) 60 MG CAPSULE    Take 60 mg by mouth daily.  ? ATORVASTATIN (LIPITOR) 10 MG TABLET    Take 1 tablet (10 mg total) by mouth daily.  ? CABOTEGRAVIR ER (APRETUDE) 600 MG/3ML INJECTION    Inject 3 mLs (600 mg total) into the muscle every 2 (two) months.  ? LEVONORGESTREL (MIRENA) 20 MCG/DAY IUD    by Intrauterine route.  ?Modified Medications  ? No medications on file  ?Discontinued Medications  ? No medications on file  ? ? ?Allergies: ?No Known Allergies ? ?Past Medical History: ?Past Medical History:  ?Diagnosis Date  ? Healthy adult on routine physical examination   ? ? ?Social History: ?Social History  ? ?Socioeconomic History  ? Marital status: Single  ?  Spouse name: Not on file  ? Number of children: Not on file  ? Years of education: Not on file  ? Highest education level: Not on file  ?Occupational History  ? Not on file  ?Tobacco Use  ? Smoking status: Never  ? Smokeless tobacco: Never  ?Vaping Use  ? Vaping Use: Never used  ?Substance and Sexual Activity  ? Alcohol use: Yes  ?  Alcohol/week: 1.0 standard drink  ?  Types: 1 Glasses of wine per week  ?  Comment: occasional use  ? Drug use: Yes  ?  Types: Marijuana  ? Sexual activity: Never  ?Other Topics Concern  ? Not on file  ?Social History Narrative  ? Not on file  ? ?Social Determinants of Health  ? ?Financial  Resource Strain: Not on file  ?Food Insecurity: Not on file  ?Transportation Needs: Not on file  ?Physical Activity: Not on file  ?Stress: Not on file  ?Social Connections: Not on file  ? ? ?Labs: ?Lab Results  ?Component Value Date  ? HIV1RNAQUANT Not Detected 07/29/2021  ? HIV1RNAQUANT Not Detected 05/24/2021  ? HIV1RNAQUANT Not Detected 03/30/2021  ? ? ?RPR and STI ?Lab Results  ?Component Value Date  ? LABRPR NON-REACTIVE 02/17/2021  ? LABRPR NON-REACTIVE 12/23/2020  ? LABRPR NON-REACTIVE 02/06/2019  ? LABRPR Non Reactive 04/21/2017  ? LABRPR NON REAC 12/26/2016  ? ? ?STI Results GC GC CT CT  ?09/16/2021 Negative - Negative -  ?02/17/2021 Negative - Negative -  ?12/22/2020 Negative - Negative -  ?02/05/2019 Negative - Negative -  ?04/17/2017 - - Negative -  ?12/26/2016 - NOT DETECTED - NOT DETECTED  ? ? ?Hepatitis B ?Lab Results  ?Component Value Date  ? HEPBSAB REACTIVE (A) 02/17/2021  ? HEPBSAG NON-REACTIVE 02/17/2021  ? ?Hepatitis C ?Lab Results  ?Component Value Date  ? HEPCAB NON-REACTIVE 02/17/2021  ? ?Hepatitis A ?Lab Results  ?Component Value Date  ? HAV REACTIVE (A) 02/17/2021  ? ?Lipids: ?Lab Results  ?Component Value Date  ? CHOL 220 (H) 09/16/2021  ? TRIG  248 (H) 09/16/2021  ? HDL 45 (L) 09/16/2021  ? CHOLHDL 4.9 09/16/2021  ? VLDL 33 (H) 12/26/2016  ? LDLCALC 137 (H) 09/16/2021  ? ? ?TARGET DATE: The 10th of the month ? ?Current PrEP Regimen: ?Apretude q62mo ? ?Assessment: ?Michele Hodge presents today for their maintenance injection of Apretude and to follow up for HIV PrEP. Past injection was tolerated well without issues. Reports that soreness after administration has decreased over time. Patient endorses no new partners since STI screening on 09/16/2021 and deferred cytologies today.  ? ?Administered cabotegravir 600mg /41mL in left upper outer quadrant of the gluteal muscle. Will make follow up appointments for maintenance injections every 2 months.  ? ?Plan: ?- Maintenance injections scheduled for 11/29/2021 with  Cassie ?- HIV RNA today ?- Call with any issues or questions ? ?Michele Hodge, Pharm.D. ?PGY-1 Pharmacy Resident ?09/27/2021 12:38 PM ?

## 2021-09-30 LAB — HIV-1 RNA QUANT-NO REFLEX-BLD
HIV 1 RNA Quant: NOT DETECTED Copies/mL
HIV-1 RNA Quant, Log: NOT DETECTED Log cps/mL

## 2021-10-02 ENCOUNTER — Encounter: Payer: Self-pay | Admitting: Internal Medicine

## 2021-10-04 ENCOUNTER — Ambulatory Visit: Payer: 59 | Admitting: Internal Medicine

## 2021-10-05 ENCOUNTER — Ambulatory Visit: Payer: 59 | Admitting: Pharmacist

## 2021-11-18 ENCOUNTER — Encounter: Payer: Self-pay | Admitting: Internal Medicine

## 2021-11-24 ENCOUNTER — Ambulatory Visit (INDEPENDENT_AMBULATORY_CARE_PROVIDER_SITE_OTHER): Payer: 59 | Admitting: Pharmacist

## 2021-11-24 ENCOUNTER — Other Ambulatory Visit: Payer: 59

## 2021-11-24 ENCOUNTER — Other Ambulatory Visit: Payer: Self-pay

## 2021-11-24 ENCOUNTER — Ambulatory Visit: Payer: 59 | Admitting: Internal Medicine

## 2021-11-24 ENCOUNTER — Other Ambulatory Visit (HOSPITAL_COMMUNITY)
Admission: RE | Admit: 2021-11-24 | Discharge: 2021-11-24 | Disposition: A | Discharge status: Home / Self Care | Payer: 59 | Source: Ambulatory Visit | Attending: Infectious Disease | Admitting: Infectious Disease

## 2021-11-24 DIAGNOSIS — Z79899 Other long term (current) drug therapy: Secondary | ICD-10-CM | POA: Insufficient documentation

## 2021-11-24 DIAGNOSIS — Z23 Encounter for immunization: Secondary | ICD-10-CM

## 2021-11-24 DIAGNOSIS — Z3202 Encounter for pregnancy test, result negative: Secondary | ICD-10-CM | POA: Diagnosis not present

## 2021-11-24 LAB — POCT URINE PREGNANCY: Preg Test, Ur: NEGATIVE

## 2021-11-24 MED ORDER — CABOTEGRAVIR ER 600 MG/3ML IM SUER
600.0000 mg | Freq: Once | INTRAMUSCULAR | Status: AC
  Administered 2021-11-24: 600 mg via INTRAMUSCULAR

## 2021-11-24 NOTE — Progress Notes (Signed)
? ?HPI: Michele Hodge is a 36 y.o. female who presents to the [facility] pharmacy clinic for Apretude administration and HIV PrEP follow up. ? ?Patient Active Problem List  ? Diagnosis Date Noted  ? Mixed hyperlipidemia 09/17/2021  ? Class 1 obesity due to excess calories with body mass index (BMI) of 30.0 to 30.9 in adult 09/16/2021  ? ADHD 09/16/2021  ? High risk sexual behavior 09/16/2021  ? Gastroesophageal reflux disease 04/17/2017  ? ? ?Patient's Medications  ?New Prescriptions  ? No medications on file  ?Previous Medications  ? AMPHETAMINE-DEXTROAMPHETAMINE (ADDERALL) 10 MG TABLET    Take 10 mg by mouth every morning.  ? ATOMOXETINE (STRATTERA) 60 MG CAPSULE    Take 60 mg by mouth daily.  ? ATORVASTATIN (LIPITOR) 10 MG TABLET    Take 1 tablet (10 mg total) by mouth daily.  ? CABOTEGRAVIR ER (APRETUDE) 600 MG/3ML INJECTION    Inject 3 mLs (600 mg total) into the muscle every 2 (two) months.  ? LEVONORGESTREL (MIRENA) 20 MCG/DAY IUD    by Intrauterine route.  ?Modified Medications  ? No medications on file  ?Discontinued Medications  ? No medications on file  ? ? ?Allergies: ?No Known Allergies ? ?Past Medical History: ?Past Medical History:  ?Diagnosis Date  ? Healthy adult on routine physical examination   ? ? ?Social History: ?Social History  ? ?Socioeconomic History  ? Marital status: Single  ?  Spouse name: Not on file  ? Number of children: Not on file  ? Years of education: Not on file  ? Highest education level: Not on file  ?Occupational History  ? Not on file  ?Tobacco Use  ? Smoking status: Never  ? Smokeless tobacco: Never  ?Vaping Use  ? Vaping Use: Never used  ?Substance and Sexual Activity  ? Alcohol use: Yes  ?  Alcohol/week: 1.0 standard drink  ?  Types: 1 Glasses of wine per week  ?  Comment: occasional use  ? Drug use: Yes  ?  Types: Marijuana  ? Sexual activity: Never  ?Other Topics Concern  ? Not on file  ?Social History Narrative  ? Not on file  ? ?Social Determinants of Health  ? ?Financial  Resource Strain: Not on file  ?Food Insecurity: Not on file  ?Transportation Needs: Not on file  ?Physical Activity: Not on file  ?Stress: Not on file  ?Social Connections: Not on file  ? ? ?Labs: ?Lab Results  ?Component Value Date  ? HIV1RNAQUANT Not Detected 09/27/2021  ? HIV1RNAQUANT Not Detected 07/29/2021  ? HIV1RNAQUANT Not Detected 05/24/2021  ? ? ?RPR and STI ?Lab Results  ?Component Value Date  ? LABRPR NON-REACTIVE 02/17/2021  ? LABRPR NON-REACTIVE 12/23/2020  ? LABRPR NON-REACTIVE 02/06/2019  ? LABRPR Non Reactive 04/21/2017  ? LABRPR NON REAC 12/26/2016  ? ? ?STI Results GC GC CT CT  ?09/16/2021 ? 3:06 PM Negative    Negative     ?02/17/2021 ? 2:41 PM Negative    Negative     ?12/22/2020 ? 3:20 PM Negative    Negative     ?02/05/2019 ?12:00 AM Negative    Negative     ?04/17/2017 ? 5:00 PM   Negative     ?12/26/2016 ?11:52 AM  NOT DETECTED    NOT DETECTED    ? ? ?Hepatitis B ?Lab Results  ?Component Value Date  ? HEPBSAB REACTIVE (A) 02/17/2021  ? HEPBSAG NON-REACTIVE 02/17/2021  ? ?Hepatitis C ?Lab Results  ?Component Value Date  ?  HEPCAB NON-REACTIVE 02/17/2021  ? ?Hepatitis A ?Lab Results  ?Component Value Date  ? HAV REACTIVE (A) 02/17/2021  ? ?Lipids: ?Lab Results  ?Component Value Date  ? CHOL 220 (H) 09/16/2021  ? TRIG 248 (H) 09/16/2021  ? HDL 45 (L) 09/16/2021  ? CHOLHDL 4.9 09/16/2021  ? VLDL 33 (H) 12/26/2016  ? LDLCALC 137 (H) 09/16/2021  ? ? ?TARGET DATE: ?10th of the month ? ?Assessment: ?Michele Hodge presents today for their Apretude injection and to follow up for HIV PrEP. No issues with past injections. No known exposures to any STIs and no signs or symptoms of any STIs today. Last STI screening was 09/16/21 and was negative. STI urine cytology ordered today. Screened patient for acute HIV symptoms such as fatigue, muscle aches, rash, sore throat, lymphadenopathy, headache, night sweats, nausea/vomiting/diarrhea, and fever. Patient denies any symptoms. No new partners since last injection. Rapid HIV  blood test was drawn immediately prior to injection and was negative. Patient also had HIV RNA drawn today. Patient expressed concern over a pain in her breast prior to her period and potential for pregnancy despite her period. A pregnancy test was ordered to confirm she was negative. ? ?Administered cabotegravir 600mg /28mL in left upper outer quadrant of the gluteal muscle. Monitored patient for 10 minutes after injection. Injection was tolerated well without issue. Will see him back in 2 months for injection, labs, and HIV PrEP follow up. Patient also started HPV vaccine series today and was counseled on the bivalent COVID booster. She expressed interest, however, she wanted to confirm insurance would cover the vaccine prior to receiving it. ? ?Plan: ?- Apretude injection administered ?- HIV RNA today ?- First HPV vaccine administered today (next injection 01/2022 and 05/2022) ?- Next injection, labs, and PrEP follow up appointment scheduled for 7/6 with Dr. 9/6 ?- Call with any issues or questions ? ?Jannette Fogo, PharmD ?PGY1 Pharmacy Resident ?[facility] ? ?

## 2021-11-24 NOTE — Progress Notes (Deleted)
Subjective:    Patient ID: Michele Hodge, female    DOB: [birth year], 36 y.o.   MRN: [id number]  HPI  Pt presents to the clinic today with c/o breast pain. This started. She describes the pain as.  Review of Systems     Past Medical History:  Diagnosis Date   Healthy adult on routine physical examination     Current Outpatient Medications  Medication Sig Dispense Refill   amphetamine-dextroamphetamine (ADDERALL) 10 MG tablet Take 10 mg by mouth every morning.     atomoxetine (STRATTERA) 60 MG capsule Take 60 mg by mouth daily.     atorvastatin (LIPITOR) 10 MG tablet Take 1 tablet (10 mg total) by mouth daily. 30 tablet 2   cabotegravir ER (APRETUDE) 600 MG/3ML injection Inject 3 mLs (600 mg total) into the muscle every 2 (two) months. 3 mL 5   levonorgestrel (MIRENA) 20 MCG/DAY IUD by Intrauterine route.     No current facility-administered medications for this visit.    No Known Allergies  Family History  Problem Relation Age of Onset   Diabetes Father 7   Stroke Maternal Grandmother    CAD Maternal Grandfather    Stroke Paternal Grandmother    Schizophrenia Brother 20   Breast cancer Neg Hx    Colon cancer Neg Hx    Ovarian cancer Neg Hx     Social History   Socioeconomic History   Marital status: Single    Spouse name: Not on file   Number of children: Not on file   Years of education: Not on file   Highest education level: Not on file  Occupational History   Not on file  Tobacco Use   Smoking status: Never   Smokeless tobacco: Never  Vaping Use   Vaping Use: Never used  Substance and Sexual Activity   Alcohol use: Yes    Alcohol/week: 1.0 standard drink    Types: 1 Glasses of wine per week    Comment: occasional use   Drug use: Yes    Types: Marijuana   Sexual activity: Never  Other Topics Concern   Not on file  Social History Narrative   Not on file   Social Determinants of Health   Financial Resource Strain: Not on file  Food  Insecurity: Not on file  Transportation Needs: Not on file  Physical Activity: Not on file  Stress: Not on file  Social Connections: Not on file  Intimate Partner Violence: Not on file     Constitutional: Denies fever, malaise, fatigue, headache or abrupt weight changes.  HEENT: Denies eye pain, eye redness, ear pain, ringing in the ears, wax buildup, runny nose, nasal congestion, bloody nose, or sore throat. Respiratory: Denies difficulty breathing, shortness of breath, cough or sputum production.   Cardiovascular: Denies chest pain, chest tightness, palpitations or swelling in the hands or feet.  Gastrointestinal: Denies abdominal pain, bloating, constipation, diarrhea or blood in the stool.  GU: Denies urgency, frequency, pain with urination, burning sensation, blood in urine, odor or discharge. Musculoskeletal: Denies decrease in range of motion, difficulty with gait, muscle pain or joint pain and swelling.  Skin: Pt reports breast pain. Denies redness, rashes, lesions or ulcercations.  Neurological: Denies dizziness, difficulty with memory, difficulty with speech or problems with balance and coordination.  Psych: Denies anxiety, depression, SI/HI.  No other specific complaints in a complete review of systems (except as listed in HPI above).  Objective:   Physical Exam   There were no  vitals taken for this visit. Wt Readings from Last 3 Encounters:  09/16/21 220 lb (99.8 kg)  12/22/20 225 lb 12.8 oz (102.4 kg)  02/05/19 227 lb 12.8 oz (103.3 kg)    General: Appears their stated age, well developed, well nourished in NAD. Skin: Warm, dry and intact. No rashes, lesions or ulcerations noted. HEENT: Head: normal shape and size; Eyes: sclera white, no icterus, conjunctiva pink, PERRLA and EOMs intact; Ears: Tm's gray and intact, normal light reflex; Nose: mucosa pink and moist, septum midline; Throat/Mouth: Teeth present, mucosa pink and moist, no exudate, lesions or ulcerations  noted.  Neck:  Neck supple, trachea midline. No masses, lumps or thyromegaly present.  Cardiovascular: Normal rate and rhythm. S1,S2 noted.  No murmur, rubs or gallops noted. No JVD or BLE edema. No carotid bruits noted. Pulmonary/Chest: Normal effort and positive vesicular breath sounds. No respiratory distress. No wheezes, rales or ronchi noted.  Abdomen: Soft and nontender. Normal bowel sounds. No distention or masses noted. Liver, spleen and kidneys non palpable. Musculoskeletal: Normal range of motion. No signs of joint swelling. No difficulty with gait.  Neurological: Alert and oriented. Cranial nerves II-XII grossly intact. Coordination normal.  Psychiatric: Mood and affect normal. Behavior is normal. Judgment and thought content normal.     BMET    Component Value Date/Time   NA 139 09/16/2021 1451   K 3.9 09/16/2021 1451   CL 107 09/16/2021 1451   CO2 22 09/16/2021 1451   GLUCOSE 125 (H) 09/16/2021 1451   BUN 10 09/16/2021 1451   CREATININE 0.82 09/16/2021 1451   CALCIUM 9.5 09/16/2021 1451   GFRNONAA 91 02/06/2019 0853   GFRAA 106 02/06/2019 0853    Lipid Panel     Component Value Date/Time   CHOL 220 (H) 09/16/2021 1451   TRIG 248 (H) 09/16/2021 1451   HDL 45 (L) 09/16/2021 1451   CHOLHDL 4.9 09/16/2021 1451   VLDL 33 (H) 12/26/2016 0909   LDLCALC 137 (H) 09/16/2021 1451    CBC    Component Value Date/Time   WBC 6.6 09/16/2021 1451   RBC 4.84 09/16/2021 1451   HGB 14.7 09/16/2021 1451   HCT 43.2 09/16/2021 1451   PLT 321 09/16/2021 1451   MCV 89.3 09/16/2021 1451   MCH 30.4 09/16/2021 1451   MCHC 34.0 09/16/2021 1451   RDW 12.2 09/16/2021 1451   LYMPHSABS 3,434 02/06/2019 0853   MONOABS 0.5 12/29/2017 0308   EOSABS 111 02/06/2019 0853   BASOSABS 68 02/06/2019 0853    Hgb A1C Lab Results  Component Value Date   HGBA1C 5.7 (H) 09/16/2021           Assessment & Plan:     [name], NP

## 2021-11-25 LAB — URINE CYTOLOGY ANCILLARY ONLY
Chlamydia: NEGATIVE
Comment: NEGATIVE
Comment: NORMAL
Neisseria Gonorrhea: NEGATIVE

## 2021-11-26 LAB — HIV-1 RNA QUANT-NO REFLEX-BLD
HIV 1 RNA Quant: NOT DETECTED copies/mL
HIV-1 RNA Quant, Log: NOT DETECTED Log copies/mL

## 2021-11-29 ENCOUNTER — Other Ambulatory Visit: Payer: 59

## 2021-11-29 ENCOUNTER — Ambulatory Visit: Payer: 59 | Admitting: Pharmacist

## 2022-01-19 ENCOUNTER — Other Ambulatory Visit: Payer: Self-pay | Admitting: Pharmacist

## 2022-01-19 DIAGNOSIS — Z79899 Other long term (current) drug therapy: Secondary | ICD-10-CM

## 2022-01-20 ENCOUNTER — Ambulatory Visit (INDEPENDENT_AMBULATORY_CARE_PROVIDER_SITE_OTHER): Payer: 59 | Admitting: Pharmacist

## 2022-01-20 ENCOUNTER — Other Ambulatory Visit: Payer: 59

## 2022-01-20 ENCOUNTER — Other Ambulatory Visit: Payer: Self-pay

## 2022-01-20 DIAGNOSIS — Z79899 Other long term (current) drug therapy: Secondary | ICD-10-CM

## 2022-01-20 MED ORDER — CABOTEGRAVIR ER 600 MG/3ML IM SUER
600.0000 mg | Freq: Once | INTRAMUSCULAR | Status: AC
  Administered 2022-01-20: 600 mg via INTRAMUSCULAR

## 2022-01-20 NOTE — Progress Notes (Signed)
HPI: Michele Hodge is a 36 y.o. female who presents to the [facility] pharmacy clinic for Apretude administration and HIV PrEP follow up.  Patient Active Problem List   Diagnosis Date Noted   Mixed hyperlipidemia 09/17/2021   Class 1 obesity due to excess calories with body mass index (BMI) of 30.0 to 30.9 in adult 09/16/2021   ADHD 09/16/2021   High risk sexual behavior 09/16/2021   Gastroesophageal reflux disease 04/17/2017    Patient's Medications  New Prescriptions   No medications on file  Previous Medications   AMPHETAMINE-DEXTROAMPHETAMINE (ADDERALL) 10 MG TABLET    Take 10 mg by mouth every morning.   ATOMOXETINE (STRATTERA) 60 MG CAPSULE    Take 60 mg by mouth daily.   ATORVASTATIN (LIPITOR) 10 MG TABLET    Take 1 tablet (10 mg total) by mouth daily.   CABOTEGRAVIR ER (APRETUDE) 600 MG/3ML INJECTION    Inject 3 mLs (600 mg total) into the muscle every 2 (two) months.   LEVONORGESTREL (MIRENA) 20 MCG/DAY IUD    by Intrauterine route.  Modified Medications   No medications on file  Discontinued Medications   No medications on file    Allergies: No Known Allergies  Past Medical History: Past Medical History:  Diagnosis Date   Healthy adult on routine physical examination     Social History: Social History   Socioeconomic History   Marital status: Single    Spouse name: Not on file   Number of children: Not on file   Years of education: Not on file   Highest education level: Not on file  Occupational History   Not on file  Tobacco Use   Smoking status: Never   Smokeless tobacco: Never  Vaping Use   Vaping Use: Never used  Substance and Sexual Activity   Alcohol use: Yes    Alcohol/week: 1.0 standard drink of alcohol    Types: 1 Glasses of wine per week    Comment: occasional use   Drug use: Yes    Types: Marijuana   Sexual activity: Never  Other Topics Concern   Not on file  Social History Narrative   Not on file   Social Determinants of Health    Financial Resource Strain: Not on file  Food Insecurity: Not on file  Transportation Needs: Not on file  Physical Activity: Not on file  Stress: Not on file  Social Connections: Not on file    Labs: Lab Results  Component Value Date   HIV1RNAQUANT NOT DETECTED 11/24/2021   HIV1RNAQUANT Not Detected 09/27/2021   HIV1RNAQUANT Not Detected 07/29/2021    RPR and STI Lab Results  Component Value Date   LABRPR NON-REACTIVE 02/17/2021   LABRPR NON-REACTIVE 12/23/2020   LABRPR NON-REACTIVE 02/06/2019   LABRPR Non Reactive 04/21/2017   LABRPR NON REAC 12/26/2016    STI Results GC GC CT CT  11/24/2021  2:24 PM Negative   Negative    09/16/2021  3:06 PM Negative   Negative    02/17/2021  2:41 PM Negative   Negative    12/22/2020  3:20 PM Negative   Negative    02/05/2019 12:00 AM Negative   Negative    04/17/2017  5:00 PM   Negative    12/26/2016 11:52 AM  NOT DETECTED   NOT DETECTED     Hepatitis B Lab Results  Component Value Date   HEPBSAB REACTIVE (A) 02/17/2021   HEPBSAG NON-REACTIVE 02/17/2021   Hepatitis C Lab Results  Component Value  Date   HEPCAB NON-REACTIVE 02/17/2021   Hepatitis A Lab Results  Component Value Date   HAV REACTIVE (A) 02/17/2021   Lipids: Lab Results  Component Value Date   CHOL 220 (H) 09/16/2021   TRIG 248 (H) 09/16/2021   HDL 45 (L) 09/16/2021   CHOLHDL 4.9 09/16/2021   VLDL 33 (H) 12/26/2016   LDLCALC 137 (H) 09/16/2021    TARGET DATE: The 10th  Assessment: Michele Hodge presents today for her Apretude injection and to follow up for HIV PrEP. No issues with past injections. No known exposures to any STIs and no signs or symptoms of any STIs today. Last STI screening was 11/24/21 and was negative. Screened for acute HIV symptoms such as fatigue, muscle aches, rash, sore throat, lymphadenopathy, headache, night sweats, nausea/vomiting/diarrhea, and fever. Denies any symptoms. No new partners since last injection. Rapid HIV blood test was  drawn immediately prior to injection and was negative. HIV RNA collected today as well.  Administered cabotegravir 600mg /70mL in left upper outer quadrant of the gluteal muscle. Injection was tolerated well without issue. Will see him back in 2 months for injection, labs, and HIV PrEP follow up.  Of note, she refused any further HPV vaccines due to cost and current lawsuit against the vaccine.  Plan: - Apretude injection administered - HIV RNA today - Next injection, labs, and PrEP follow up appointment scheduled for 03/24/22 with me - Call with any issues or questions  Michele Hodge L. Trell Secrist, PharmD, BCIDP, AAHIVP, CPP Clinical Pharmacist Practitioner Infectious Diseases Clinical Pharmacist [facility]

## 2022-01-22 LAB — HIV-1 RNA QUANT-NO REFLEX-BLD
HIV 1 RNA Quant: NOT DETECTED Copies/mL
HIV-1 RNA Quant, Log: NOT DETECTED Log cps/mL

## 2022-01-28 ENCOUNTER — Other Ambulatory Visit: Payer: Self-pay | Admitting: Pharmacist

## 2022-01-28 DIAGNOSIS — Z79899 Other long term (current) drug therapy: Secondary | ICD-10-CM

## 2022-02-18 NOTE — Progress Notes (Signed)
Ultrasound was checked for IUD placement for irregular menses [name], NP

## 2022-03-11 ENCOUNTER — Other Ambulatory Visit (HOSPITAL_COMMUNITY): Payer: Self-pay

## 2022-03-14 ENCOUNTER — Telehealth: Payer: Self-pay

## 2022-03-14 ENCOUNTER — Other Ambulatory Visit (HOSPITAL_COMMUNITY): Payer: Self-pay

## 2022-03-14 NOTE — Telephone Encounter (Signed)
[facility] Patient Advocate Encounter   Received notification from [facility] that prior authorization for Apretude is required.   PA submitted on 03/14/2022 Key [id number] Status is pending    [facility] will continue to follow.   Clearance Coots, CPhT Specialty Pharmacy Patient [facility] for Infectious Disease Phone: [phone] Fax:  [phone]

## 2022-03-14 NOTE — Telephone Encounter (Signed)
[facility] Patient Advocate Encounter  Received notification from [employer] that the request for prior authorization for Apretude has been denied due to prescription benefit plan does not cover the requested medication.    I will try to see if Apretude is covered under the patient medical benefits.    This encounter will continue to be updated until final determination.    Clearance Coots, CPhT Specialty Pharmacy Patient [facility] for Infectious Disease Phone: [phone] Fax:  [phone]

## 2022-03-18 ENCOUNTER — Telehealth: Payer: Self-pay

## 2022-03-18 NOTE — Telephone Encounter (Signed)
[facility] Patient Advocate Encounter  [phone] & [phone] no Josem Kaufmann is required under patient [place] . Ref # [id number]  Patient have 25% coinsurance No office copay no deductible, Out of pocket $1700- which $0.00 have been met. Ref # [id number]  [employer] # [phone]  Ileene Patrick, [place] Patient [place] for Infectious Disease Phone: [phone] Fax:  [phone]

## 2022-03-18 NOTE — Telephone Encounter (Signed)
Thanks Donna!

## 2022-03-24 ENCOUNTER — Ambulatory Visit: Payer: Self-pay | Admitting: Pharmacist

## 2022-03-24 ENCOUNTER — Other Ambulatory Visit: Payer: 59

## 2022-03-31 ENCOUNTER — Other Ambulatory Visit: Payer: 59

## 2022-03-31 ENCOUNTER — Other Ambulatory Visit: Payer: Self-pay

## 2022-03-31 ENCOUNTER — Other Ambulatory Visit: Payer: Self-pay | Admitting: Pharmacist

## 2022-03-31 ENCOUNTER — Ambulatory Visit (INDEPENDENT_AMBULATORY_CARE_PROVIDER_SITE_OTHER): Payer: 59 | Admitting: Pharmacist

## 2022-03-31 DIAGNOSIS — Z79899 Other long term (current) drug therapy: Secondary | ICD-10-CM | POA: Diagnosis not present

## 2022-03-31 MED ORDER — CABOTEGRAVIR ER 600 MG/3ML IM SUER
600.0000 mg | Freq: Once | INTRAMUSCULAR | Status: AC
  Administered 2022-03-31: 600 mg via INTRAMUSCULAR

## 2022-03-31 NOTE — Progress Notes (Signed)
HPI: Yasha Tibbett is a 36 y.o. female who presents to the [facility] pharmacy clinic for Apretude administration and HIV PrEP follow up.  Patient Active Problem List   Diagnosis Date Noted   Mixed hyperlipidemia 09/17/2021   Class 1 obesity due to excess calories with body mass index (BMI) of 30.0 to 30.9 in adult 09/16/2021   ADHD 09/16/2021   High risk sexual behavior 09/16/2021   Gastroesophageal reflux disease 04/17/2017    Patient's Medications  New Prescriptions   No medications on file  Previous Medications   AMPHETAMINE-DEXTROAMPHETAMINE (ADDERALL) 10 MG TABLET    Take 10 mg by mouth every morning.   ATOMOXETINE (STRATTERA) 60 MG CAPSULE    Take 60 mg by mouth daily.   ATORVASTATIN (LIPITOR) 10 MG TABLET    Take 1 tablet (10 mg total) by mouth daily.   CABOTEGRAVIR ER (APRETUDE) 600 MG/3ML INJECTION    Inject 3 mLs (600 mg total) into the muscle every 2 (two) months.   LEVONORGESTREL (MIRENA) 20 MCG/DAY IUD    by Intrauterine route.  Modified Medications   No medications on file  Discontinued Medications   No medications on file    Allergies: No Known Allergies  Past Medical History: Past Medical History:  Diagnosis Date   Healthy adult on routine physical examination     Social History: Social History   Socioeconomic History   Marital status: Single    Spouse name: Not on file   Number of children: Not on file   Years of education: Not on file   Highest education level: Not on file  Occupational History   Not on file  Tobacco Use   Smoking status: Never   Smokeless tobacco: Never  Vaping Use   Vaping Use: Never used  Substance and Sexual Activity   Alcohol use: Yes    Alcohol/week: 1.0 standard drink of alcohol    Types: 1 Glasses of wine per week    Comment: occasional use   Drug use: Yes    Types: Marijuana   Sexual activity: Never  Other Topics Concern   Not on file  Social History Narrative   Not on file   Social Determinants of Health    Financial Resource Strain: Not on file  Food Insecurity: Not on file  Transportation Needs: Not on file  Physical Activity: Not on file  Stress: Not on file  Social Connections: Not on file    Labs: Lab Results  Component Value Date   HIV1RNAQUANT Not Detected 01/20/2022   HIV1RNAQUANT NOT DETECTED 11/24/2021   HIV1RNAQUANT Not Detected 09/27/2021    RPR and STI Lab Results  Component Value Date   LABRPR NON-REACTIVE 02/17/2021   LABRPR NON-REACTIVE 12/23/2020   LABRPR NON-REACTIVE 02/06/2019   LABRPR Non Reactive 04/21/2017   LABRPR NON REAC 12/26/2016    STI Results GC GC CT CT  11/24/2021  2:24 PM Negative   Negative    09/16/2021  3:06 PM Negative   Negative    02/17/2021  2:41 PM Negative   Negative    12/22/2020  3:20 PM Negative   Negative    02/05/2019 12:00 AM Negative   Negative    04/17/2017  5:00 PM   Negative    12/26/2016 11:52 AM  NOT DETECTED   NOT DETECTED     Hepatitis B Lab Results  Component Value Date   HEPBSAB REACTIVE (A) 02/17/2021   HEPBSAG NON-REACTIVE 02/17/2021   Hepatitis C Lab Results  Component Value  Date   HEPCAB NON-REACTIVE 02/17/2021   Hepatitis A Lab Results  Component Value Date   HAV REACTIVE (A) 02/17/2021   Lipids: Lab Results  Component Value Date   CHOL 220 (H) 09/16/2021   TRIG 248 (H) 09/16/2021   HDL 45 (L) 09/16/2021   CHOLHDL 4.9 09/16/2021   VLDL 33 (H) 12/26/2016   LDLCALC 137 (H) 09/16/2021    TARGET DATE: The 10th  Assessment: Vestal presents today for her Apretude injection and to follow up for HIV PrEP. No issues with past injections. Screened for acute HIV symptoms such as fatigue, muscle aches, rash, sore throat, lymphadenopathy, headache, night sweats, nausea/vomiting/diarrhea, and fever. Denies any symptoms. No known exposures to any STIs and no signs or symptoms of any STIs today.   Per [employer] guidelines, a rapid HIV test should be drawn prior to Apretude administration. Due to state  shortage of rapid HIV tests, this is temporarily unable to be done. Per decision from [facility] physicians, we will proceed with Apretude administration at this time without a negative rapid HIV test beforehand. HIV RNA was collected today and is in process.  Administered cabotegravir 600mg /55mL in left upper outer quadrant of the gluteal muscle. Injection was tolerated well without issue. Will see her back in 2 months for injection, labs, and HIV PrEP follow up.  Plan: - Apretude injection administered - HIV RNA today - Next injection, labs, and PrEP follow up appointment scheduled for 05/30/22 with me - Call with any issues or questions  Jesten Cappuccio L. Kandie Keiper, PharmD, BCIDP, AAHIVP, CPP Clinical Pharmacist Practitioner Infectious Diseases Clinical Pharmacist [facility]

## 2022-04-01 ENCOUNTER — Other Ambulatory Visit: Payer: 59

## 2022-04-01 NOTE — Addendum Note (Signed)
Addended by: Harley Alto on: 04/01/2022 10:48 AM   Modules accepted: Orders

## 2022-04-04 ENCOUNTER — Other Ambulatory Visit: Payer: 59

## 2022-04-04 ENCOUNTER — Other Ambulatory Visit: Payer: Self-pay

## 2022-04-04 DIAGNOSIS — Z79899 Other long term (current) drug therapy: Secondary | ICD-10-CM

## 2022-04-07 LAB — HIV-1 RNA QUANT-NO REFLEX-BLD
HIV 1 RNA Quant: NOT DETECTED Copies/mL
HIV-1 RNA Quant, Log: NOT DETECTED Log cps/mL

## 2022-05-05 ENCOUNTER — Ambulatory Visit (INDEPENDENT_AMBULATORY_CARE_PROVIDER_SITE_OTHER): Payer: 59 | Admitting: Internal Medicine

## 2022-05-05 ENCOUNTER — Encounter: Payer: Self-pay | Admitting: Internal Medicine

## 2022-05-05 ENCOUNTER — Ambulatory Visit: Payer: Self-pay | Admitting: Internal Medicine

## 2022-05-05 ENCOUNTER — Telehealth: Payer: Self-pay

## 2022-05-05 VITALS — BP 118/74 | HR 97 | Temp 96.8°F | Wt 234.0 lb

## 2022-05-05 DIAGNOSIS — E782 Mixed hyperlipidemia: Secondary | ICD-10-CM

## 2022-05-05 DIAGNOSIS — L509 Urticaria, unspecified: Secondary | ICD-10-CM

## 2022-05-05 DIAGNOSIS — Z6832 Body mass index (BMI) 32.0-32.9, adult: Secondary | ICD-10-CM

## 2022-05-05 DIAGNOSIS — K219 Gastro-esophageal reflux disease without esophagitis: Secondary | ICD-10-CM

## 2022-05-05 DIAGNOSIS — R7303 Prediabetes: Secondary | ICD-10-CM | POA: Diagnosis not present

## 2022-05-05 DIAGNOSIS — F902 Attention-deficit hyperactivity disorder, combined type: Secondary | ICD-10-CM

## 2022-05-05 DIAGNOSIS — N644 Mastodynia: Secondary | ICD-10-CM

## 2022-05-05 DIAGNOSIS — E6609 Other obesity due to excess calories: Secondary | ICD-10-CM

## 2022-05-05 NOTE — Assessment & Plan Note (Signed)
A1c today Encourage low-carb diet and exercise for weight loss 

## 2022-05-05 NOTE — Assessment & Plan Note (Signed)
Avoid foods that trigger reflux Encourage weight loss as this can help reduce reflux symptoms Continue Tums as needed 

## 2022-05-05 NOTE — Patient Instructions (Signed)
Breast Tenderness Breast tenderness is a common problem for women of all ages, but may also occur in men. Breast tenderness has many possible causes, including hormone changes, infections, taking certain medicines, and caffeine intake. In women, the pain usually comes and goes with the menstrual cycle, but it can also be constant. Breast tenderness may range from mild discomfort to severe pain. You may have tests, such as a mammogram or an ultrasound, to check for any unusual findings. Having breast tenderness usually does not mean that you have breast cancer. Follow these instructions at home: Managing pain and discomfort  If directed, put ice on the painful area. To do this: Put ice in a plastic bag. Place a towel between your skin and the bag. Leave the ice on for 20 minutes, 2-3 times a day. If your skin turns bright red, remove the ice right away to prevent skin damage. The risk of skin damage is higher if you cannot feel pain, heat, or cold. Wear a supportive bra or chest support: During exercise. While sleeping, if your breasts are very tender. Medicines Take over-the-counter and prescription medicines only as told by your health care provider. If the cause of your pain is an infection, you may be prescribed an antibiotic medicine. If you were prescribed antibiotics, take them as told by your health care provider. Do not stop using the antibiotic even if you start to feel better. Eating and drinking Decrease the amount of caffeine in your diet. Instead, drink more water and choose caffeine-free drinks. Your health care provider may recommend that you lessen the amount of fat in your diet. You can do this by: Limiting fried foods. Cooking foods using methods such as baking, boiling, grilling, and broiling. General instructions  Keep a log of the days and times when your breasts are most tender. Ask your health care provider how to do breast exams at home. This will help you notice if  you have an unusual growth or lump. Keep all follow-up visits. Contact a health care provider if: Any part of your breast is hard, red, and hot to the touch. This may be a sign of infection. You are a woman and have a new or painful lump in your breast that remains after your menstrual period ends. You are not breastfeeding and you have fluid, especially blood or pus, coming out of your nipples. You have a fever. Your pain does not improve or it gets worse. Your pain is interfering with your daily activities. Summary Breast tenderness may range from mild discomfort to severe pain. Breast tenderness has many possible causes, including hormone changes, infections, taking certain medicines, and caffeine intake. It can be treated with ice, wearing a supportive bra or chest support, and medicines. Make changes to your diet as told by your health care provider. This information is not intended to replace advice given to you by your health care provider. Make sure you discuss any questions you have with your health care provider. Document Revised: 09/15/2021 Document Reviewed: 09/15/2021 Elsevier Patient Education  2023 Elsevier Inc.  

## 2022-05-05 NOTE — Telephone Encounter (Signed)
Are you able to add this preference to her allergy referral that I placed today?

## 2022-05-05 NOTE — Assessment & Plan Note (Signed)
Encourage diet and exercise for weight loss 

## 2022-05-05 NOTE — Assessment & Plan Note (Signed)
C-Met and lipid profile today Encouraged her to consume low-fat diet 

## 2022-05-05 NOTE — Assessment & Plan Note (Signed)
Continue Strattera and Adderall as prescribed by psychiatry

## 2022-05-05 NOTE — Progress Notes (Signed)
Subjective:    Patient ID: Michele Hodge, female    DOB: 03/13/1986, 36 y.o.   MRN: [id number]  HPI  Patient presents to clinic today for follow-up of chronic conditions.  GERD: Triggered by tomato based and spicy foods.  She takes Tums as needed with good relief of symptoms.  There is no upper GI on file.  HLD: Her last LDL was 137, triglycerides 248, 09/2021.  She is not taking Atorvastatin as prescribed.  She tries to consume low-fat diet.  ADHD: Managed with Blase Mess and Adderall.  She follows with psychiatry.  Prediabetes: Her last A1c was 5.7%, 09/2021.  She is not taking any oral diabetic medication at this time.  She does not check her sugars.    She also reports right breast pain. She describes the pain as shooting.  She reports this occurred intermittently for months. She denies any changes in the skin or nipple discharge. She a history of breast cancer in maternal cousin recently.  She also reports rash of buttocks but reports intermittent rashes that occur in random places. She would like a referral for allergy testing.  Review of Systems     Past Medical History:  Diagnosis Date   Healthy adult on routine physical examination     Current Outpatient Medications  Medication Sig Dispense Refill   amphetamine-dextroamphetamine (ADDERALL) 10 MG tablet Take 10 mg by mouth every morning.     atomoxetine (STRATTERA) 60 MG capsule Take 60 mg by mouth daily.     levonorgestrel (MIRENA) 20 MCG/DAY IUD by Intrauterine route.     atorvastatin (LIPITOR) 10 MG tablet Take 1 tablet (10 mg total) by mouth daily. (Patient not taking: Reported on 05/05/2022) 30 tablet 2   cabotegravir ER (APRETUDE) 600 MG/3ML injection Inject 3 mLs (600 mg total) into the muscle every 2 (two) months. 3 mL 5   No current facility-administered medications for this visit.    No Known Allergies  Family History  Problem Relation Age of Onset   Diabetes Father 16   Stroke Maternal Grandmother     CAD Maternal Grandfather    Stroke Paternal Grandmother    Schizophrenia Brother 20   Breast cancer Neg Hx    Colon cancer Neg Hx    Ovarian cancer Neg Hx     Social History   Socioeconomic History   Marital status: Single    Spouse name: Not on file   Number of children: Not on file   Years of education: Not on file   Highest education level: Not on file  Occupational History   Not on file  Tobacco Use   Smoking status: Never   Smokeless tobacco: Never  Vaping Use   Vaping Use: Never used  Substance and Sexual Activity   Alcohol use: Yes    Alcohol/week: 1.0 standard drink of alcohol    Types: 1 Glasses of wine per week    Comment: occasional use   Drug use: Yes    Types: Marijuana   Sexual activity: Never  Other Topics Concern   Not on file  Social History Narrative   Not on file   Social Determinants of Health   Financial Resource Strain: Not on file  Food Insecurity: Not on file  Transportation Needs: Not on file  Physical Activity: Not on file  Stress: Not on file  Social Connections: Not on file  Intimate Partner Violence: Not on file     Constitutional: Denies fever, malaise, fatigue, headache or abrupt weight  changes.  HEENT: Denies eye pain, eye redness, ear pain, ringing in the ears, wax buildup, runny nose, nasal congestion, bloody nose, or sore throat. Respiratory: Denies difficulty breathing, shortness of breath, cough or sputum production.   Cardiovascular: Denies chest pain, chest tightness, palpitations or swelling in the hands or feet.  Gastrointestinal: Patient orts intermittent reflux.  Denies abdominal pain, bloating, constipation, diarrhea or blood in the stool.  GU: Denies urgency, frequency, pain with urination, burning sensation, blood in urine, odor or discharge. Musculoskeletal: Denies decrease in range of motion, difficulty with gait, muscle pain or joint pain and swelling.  Skin: Patient reports right breast pain, intermittent hives.   Denies redness, lesions or ulcercations.  Neurological: Patient reports inattention.  Denies dizziness, difficulty with memory, difficulty with speech or problems with balance and coordination.  Psych: Denies anxiety, depression, SI/HI.  No other specific complaints in a complete review of systems (except as listed in HPI above).  Objective:   Physical Exam  BP 118/74 (BP Location: Right Arm, Patient Position: Sitting, Cuff Size: Normal)   Pulse 97   Temp (!) 96.8 F (36 C) (Temporal)   Wt 234 lb (106.1 kg)   SpO2 98%   BMI 32.64 kg/m  Wt Readings from Last 3 Encounters:  05/05/22 234 lb (106.1 kg)  09/16/21 220 lb (99.8 kg)  12/22/20 225 lb 12.8 oz (102.4 kg)    General: Appears her stated age, obese, in NAD. Skin: Warm, dry and intact.  Breast symmetrical.  Fibrocystic changes noted of the right breast.  No changes in the skin of the breast.  No nipple discharge or axillary lymphadenopathy noted.  Hives noted to buttocks. HEENT: Head: normal shape and size; Eyes: sclera white, no icterus, conjunctiva pink, PERRLA and EOMs intact;  Cardiovascular: Normal rate and rhythm. S1,S2 noted.  No murmur, rubs or gallops noted.  Pulmonary/Chest: Normal effort and positive vesicular breath sounds. No respiratory distress. No wheezes, rales or ronchi noted.  Abdomen: Normal bowel sounds.  Musculoskeletal: No difficulty with gait.  Neurological: Alert and oriented.    BMET    Component Value Date/Time   NA 139 09/16/2021 1451   K 3.9 09/16/2021 1451   CL 107 09/16/2021 1451   CO2 22 09/16/2021 1451   GLUCOSE 125 (H) 09/16/2021 1451   BUN 10 09/16/2021 1451   CREATININE 0.82 09/16/2021 1451   CALCIUM 9.5 09/16/2021 1451   GFRNONAA 91 02/06/2019 0853   GFRAA 106 02/06/2019 0853    Lipid Panel     Component Value Date/Time   CHOL 220 (H) 09/16/2021 1451   TRIG 248 (H) 09/16/2021 1451   HDL 45 (L) 09/16/2021 1451   CHOLHDL 4.9 09/16/2021 1451   VLDL 33 (H) 12/26/2016 0909    LDLCALC 137 (H) 09/16/2021 1451    CBC    Component Value Date/Time   WBC 6.6 09/16/2021 1451   RBC 4.84 09/16/2021 1451   HGB 14.7 09/16/2021 1451   HCT 43.2 09/16/2021 1451   PLT 321 09/16/2021 1451   MCV 89.3 09/16/2021 1451   MCH 30.4 09/16/2021 1451   MCHC 34.0 09/16/2021 1451   RDW 12.2 09/16/2021 1451   LYMPHSABS 3,434 02/06/2019 0853   MONOABS 0.5 12/29/2017 0308   EOSABS 111 02/06/2019 0853   BASOSABS 68 02/06/2019 0853    Hgb A1C Lab Results  Component Value Date   HGBA1C 5.7 (H) 09/16/2021           Assessment & Plan:  Right Breast Pain:  We will obtain diagnostic mammogram of both breast and ultrasound of right breast  Hives:  Encouraged her to take Benadryl as needed when symptoms occur Referral to allergist for further evaluation and treatment  RTC in 6 months for your annual exam Webb Silversmith, NP

## 2022-05-05 NOTE — Telephone Encounter (Signed)
Copied from [place] [id number]. Topic: Referral - Status >> May 05, 2022 10:28 AM Cyndi Bender wrote: Reason for CRM: Pt requests that the referral be sent to [facility] [place], [place] fax# [phone] for Dr. Mosetta Anis

## 2022-05-06 LAB — COMPLETE METABOLIC PANEL WITH GFR
AG Ratio: 1.5 (calc) (ref 1.0–2.5)
ALT: 25 U/L (ref 6–29)
AST: 18 U/L (ref 10–30)
Albumin: 4.1 g/dL (ref 3.6–5.1)
Alkaline phosphatase (APISO): 43 U/L (ref 31–125)
BUN: 11 mg/dL (ref 7–25)
CO2: 26 mmol/L (ref 20–32)
Calcium: 9.1 mg/dL (ref 8.6–10.2)
Chloride: 102 mmol/L (ref 98–110)
Creat: 0.74 mg/dL (ref 0.50–0.97)
Globulin: 2.8 g/dL (calc) (ref 1.9–3.7)
Glucose, Bld: 96 mg/dL (ref 65–99)
Potassium: 4.2 mmol/L (ref 3.5–5.3)
Sodium: 135 mmol/L (ref 135–146)
Total Bilirubin: 0.4 mg/dL (ref 0.2–1.2)
Total Protein: 6.9 g/dL (ref 6.1–8.1)
eGFR: 107 mL/min/{1.73_m2} (ref >=60)

## 2022-05-06 LAB — HEMOGLOBIN A1C
Hgb A1c MFr Bld: 5.7 % of total Hgb — ABNORMAL HIGH (ref <5.7)
Mean Plasma Glucose: 117 mg/dL
eAG (mmol/L): 6.5 mmol/L

## 2022-05-06 LAB — LIPID PANEL
Cholesterol: 200 mg/dL — ABNORMAL HIGH (ref <200)
HDL: 50 mg/dL (ref >=50)
LDL Cholesterol (Calc): 115 mg/dL (calc) — ABNORMAL HIGH
Non-HDL Cholesterol (Calc): 150 mg/dL (calc) — ABNORMAL HIGH (ref <130)
Total CHOL/HDL Ratio: 4 (calc) (ref <5.0)
Triglycerides: 229 mg/dL — ABNORMAL HIGH (ref <150)

## 2022-05-12 ENCOUNTER — Ambulatory Visit
Admission: RE | Admit: 2022-05-12 | Discharge: 2022-05-12 | Disposition: A | Discharge status: Home / Self Care | Payer: 59 | Source: Ambulatory Visit | Attending: Internal Medicine | Admitting: Internal Medicine

## 2022-05-12 ENCOUNTER — Encounter: Payer: Self-pay | Admitting: Radiology

## 2022-05-12 DIAGNOSIS — N644 Mastodynia: Secondary | ICD-10-CM | POA: Diagnosis present

## 2022-05-24 ENCOUNTER — Other Ambulatory Visit: Payer: 59

## 2022-05-30 ENCOUNTER — Encounter: Payer: Self-pay | Admitting: Internal Medicine

## 2022-05-30 ENCOUNTER — Other Ambulatory Visit: Payer: Self-pay

## 2022-05-30 ENCOUNTER — Ambulatory Visit (INDEPENDENT_AMBULATORY_CARE_PROVIDER_SITE_OTHER): Payer: 59 | Admitting: Pharmacist

## 2022-05-30 DIAGNOSIS — Z79899 Other long term (current) drug therapy: Secondary | ICD-10-CM | POA: Diagnosis not present

## 2022-05-30 MED ORDER — CABOTEGRAVIR ER 600 MG/3ML IM SUER
600.0000 mg | Freq: Once | INTRAMUSCULAR | Status: AC
  Administered 2022-05-30: 600 mg via INTRAMUSCULAR

## 2022-05-30 NOTE — Progress Notes (Signed)
HPI: Michele Hodge is a 36 y.o. female who presents to the [facility] pharmacy clinic for Apretude administration and HIV PrEP follow up.  Patient Active Problem List   Diagnosis Date Noted   Prediabetes 05/05/2022   Mixed hyperlipidemia 09/17/2021   Class 1 obesity due to excess calories with body mass index (BMI) of 32.0 to 32.9 in adult 09/16/2021   ADHD 09/16/2021   Gastroesophageal reflux disease 04/17/2017    Patient's Medications  New Prescriptions   No medications on file  Previous Medications   AMPHETAMINE-DEXTROAMPHETAMINE (ADDERALL) 10 MG TABLET    Take 10 mg by mouth every morning.   ATOMOXETINE (STRATTERA) 60 MG CAPSULE    Take 60 mg by mouth daily.   ATORVASTATIN (LIPITOR) 10 MG TABLET    Take 1 tablet (10 mg total) by mouth daily.   LEVONORGESTREL (MIRENA) 20 MCG/DAY IUD    by Intrauterine route.  Modified Medications   No medications on file  Discontinued Medications   No medications on file    Allergies: No Known Allergies  Past Medical History: Past Medical History:  Diagnosis Date   Healthy adult on routine physical examination     Social History: Social History   Socioeconomic History   Marital status: Single    Spouse name: Not on file   Number of children: Not on file   Years of education: Not on file   Highest education level: Not on file  Occupational History   Not on file  Tobacco Use   Smoking status: Never   Smokeless tobacco: Never  Vaping Use   Vaping Use: Never used  Substance and Sexual Activity   Alcohol use: Yes    Alcohol/week: 1.0 standard drink of alcohol    Types: 1 Glasses of wine per week    Comment: occasional use   Drug use: Yes    Types: Marijuana   Sexual activity: Never  Other Topics Concern   Not on file  Social History Narrative   Not on file   Social Determinants of Health   Financial Resource Strain: Not on file  Food Insecurity: Not on file  Transportation Needs: Not on file  Physical Activity: Not on  file  Stress: Not on file  Social Connections: Not on file    Labs: Lab Results  Component Value Date   HIV1RNAQUANT Not Detected 04/04/2022   HIV1RNAQUANT Not Detected 01/20/2022   HIV1RNAQUANT NOT DETECTED 11/24/2021    RPR and STI Lab Results  Component Value Date   LABRPR NON-REACTIVE 02/17/2021   LABRPR NON-REACTIVE 12/23/2020   LABRPR NON-REACTIVE 02/06/2019   LABRPR Non Reactive 04/21/2017   LABRPR NON REAC 12/26/2016    STI Results GC GC CT CT  11/24/2021  2:24 PM Negative   Negative    09/16/2021  3:06 PM Negative   Negative    02/17/2021  2:41 PM Negative   Negative    12/22/2020  3:20 PM Negative   Negative    02/05/2019 12:00 AM Negative   Negative    04/17/2017  5:00 PM   Negative    12/26/2016 11:52 AM  NOT DETECTED   NOT DETECTED     Hepatitis B Lab Results  Component Value Date   HEPBSAB REACTIVE (A) 02/17/2021   HEPBSAG NON-REACTIVE 02/17/2021   Hepatitis C Lab Results  Component Value Date   HEPCAB NON-REACTIVE 02/17/2021   Hepatitis A Lab Results  Component Value Date   HAV REACTIVE (A) 02/17/2021   Lipids: Lab Results  Component Value Date   CHOL 200 (H) 05/05/2022   TRIG 229 (H) 05/05/2022   HDL 50 05/05/2022   CHOLHDL 4.0 05/05/2022   VLDL 33 (H) 12/26/2016   LDLCALC 115 (H) 05/05/2022    TARGET DATE: The 10th  Assessment: Michele Hodge presents today for her Apretude injection and to follow up for HIV PrEP. No issues with past injections. Screened for acute HIV symptoms such as fatigue, muscle aches, rash, sore throat, lymphadenopathy, headache, night sweats, nausea/vomiting/diarrhea, and fever. Denies any symptoms. No known exposures to any STIs and no signs or symptoms of any STIs today.   Per [employer] guidelines, a rapid HIV test should be drawn prior to Apretude administration. Due to state shortage of rapid HIV tests, this is temporarily unable to be done. Per decision from [facility] physicians, we will proceed with Apretude  administration at this time without a negative rapid HIV test beforehand. HIV RNA was collected today and is in process.  Administered cabotegravir 600mg /78mL in left upper outer quadrant of the gluteal muscle. Injection was tolerated well without issue. Will see her back in 2 months for injection, labs, and HIV PrEP follow up.  Plan: - Apretude injection administered - HIV RNA today - Next injection, labs, and PrEP follow up appointment scheduled for 07/25/22 - Call with any issues or questions  Keagen Heinlen L. Tomeshia Pizzi, PharmD, BCIDP, AAHIVP, CPP Clinical Pharmacist Practitioner Infectious Diseases Clinical Pharmacist [facility]

## 2022-06-01 LAB — HIV-1 RNA QUANT-NO REFLEX-BLD
HIV 1 RNA Quant: NOT DETECTED Copies/mL
HIV-1 RNA Quant, Log: NOT DETECTED Log cps/mL

## 2022-06-27 ENCOUNTER — Encounter: Payer: Self-pay | Admitting: Internal Medicine

## 2022-06-28 MED ORDER — ATORVASTATIN CALCIUM 10 MG PO TABS: 10.0000 mg | ORAL_TABLET | Freq: Every day | ORAL | 2 refills | Status: DC

## 2022-06-29 ENCOUNTER — Ambulatory Visit (INDEPENDENT_AMBULATORY_CARE_PROVIDER_SITE_OTHER): Payer: 59 | Admitting: Internal Medicine

## 2022-06-29 ENCOUNTER — Encounter: Payer: Self-pay | Admitting: Internal Medicine

## 2022-06-29 ENCOUNTER — Other Ambulatory Visit (HOSPITAL_COMMUNITY)
Admission: RE | Admit: 2022-06-29 | Discharge: 2022-06-29 | Disposition: A | Discharge status: Home / Self Care | Payer: 59 | Source: Ambulatory Visit | Attending: Internal Medicine | Admitting: Internal Medicine

## 2022-06-29 VITALS — BP 132/78 | HR 92 | Temp 96.9°F | Wt 233.0 lb

## 2022-06-29 DIAGNOSIS — N898 Other specified noninflammatory disorders of vagina: Secondary | ICD-10-CM | POA: Insufficient documentation

## 2022-06-29 NOTE — Progress Notes (Signed)
Subjective:    Patient ID: Michele Hodge, female    DOB: [birth year], 36 y.o.   MRN: [id number]  HPI  Patient presents to clinic today with complaint of vaginal itching. She feels like the itching is external. She denies pelvic pressure, discharge, odor. She denies urinary symptoms. She has tried Monistat OTC with some improvement in symptoms. She is sexually active and would like to be checked for STD's.    Review of Systems   Past Medical History:  Diagnosis Date   Healthy adult on routine physical examination     Current Outpatient Medications  Medication Sig Dispense Refill   amphetamine-dextroamphetamine (ADDERALL) 10 MG tablet Take 10 mg by mouth every morning.     atomoxetine (STRATTERA) 60 MG capsule Take 60 mg by mouth daily.     atorvastatin (LIPITOR) 10 MG tablet Take 1 tablet (10 mg total) by mouth daily. 30 tablet 2   levonorgestrel (MIRENA) 20 MCG/DAY IUD by Intrauterine route.     No current facility-administered medications for this visit.    No Known Allergies  Family History  Problem Relation Age of Onset   Diabetes Father 54   Stroke Maternal Grandmother    CAD Maternal Grandfather    Stroke Paternal Grandmother    Breast cancer Cousin    Schizophrenia Brother 20   Colon cancer Neg Hx    Ovarian cancer Neg Hx     Social History   Socioeconomic History   Marital status: Single    Spouse name: Not on file   Number of children: Not on file   Years of education: Not on file   Highest education level: Not on file  Occupational History   Not on file  Tobacco Use   Smoking status: Never   Smokeless tobacco: Never  Vaping Use   Vaping Use: Never used  Substance and Sexual Activity   Alcohol use: Yes    Alcohol/week: 1.0 standard drink of alcohol    Types: 1 Glasses of wine per week    Comment: occasional use   Drug use: Yes    Types: Marijuana   Sexual activity: Never  Other Topics Concern   Not on file  Social History Narrative   Not  on file   Social Determinants of Health   Financial Resource Strain: Not on file  Food Insecurity: Not on file  Transportation Needs: Not on file  Physical Activity: Not on file  Stress: Not on file  Social Connections: Not on file  Intimate Partner Violence: Not on file     Constitutional: Denies fever, malaise, fatigue, headache or abrupt weight changes.  Respiratory: Denies difficulty breathing, shortness of breath, cough or sputum production.   Cardiovascular: Denies chest pain, chest tightness, palpitations or swelling in the hands or feet.  Gastrointestinal: Denies abdominal pain, bloating, constipation, diarrhea or blood in the stool.  GU: Patient reports vaginal itching.  Denies urgency, frequency, pain with urination, burning sensation, blood in urine, odor or discharge.   No other specific complaints in a complete review of systems (except as listed in HPI above).     Objective:   Physical Exam BP 132/78 (BP Location: Left Arm, Patient Position: Sitting, Cuff Size: Normal)   Pulse 92   Temp (!) 96.9 F (36.1 C) (Temporal)   Wt 233 lb (105.7 kg)   SpO2 99%   BMI 32.50 kg/m   Wt Readings from Last 3 Encounters:  05/05/22 234 lb (106.1 kg)  09/16/21 220 lb (99.8 kg)  12/22/20 225 lb 12.8 oz (102.4 kg)    General: Appears her stated age, obese, in NAD. Cardiovascular: Normal rate and rhythm. S1,S2 noted.  No murmur, rubs or gallops noted. No JVD or BLE edema. No carotid bruits noted. Pulmonary/Chest: Normal effort and positive vesicular breath sounds. No respiratory distress. No wheezes, rales or ronchi noted.  Pelvic: Self swabbed. Neurological: Alert and oriented.    BMET    Component Value Date/Time   NA 135 05/05/2022 1004   K 4.2 05/05/2022 1004   CL 102 05/05/2022 1004   CO2 26 05/05/2022 1004   GLUCOSE 96 05/05/2022 1004   BUN 11 05/05/2022 1004   CREATININE 0.74 05/05/2022 1004   CALCIUM 9.1 05/05/2022 1004   GFRNONAA 91 02/06/2019 0853    GFRAA 106 02/06/2019 0853    Lipid Panel     Component Value Date/Time   CHOL 200 (H) 05/05/2022 1004   TRIG 229 (H) 05/05/2022 1004   HDL 50 05/05/2022 1004   CHOLHDL 4.0 05/05/2022 1004   VLDL 33 (H) 12/26/2016 0909   LDLCALC 115 (H) 05/05/2022 1004    CBC    Component Value Date/Time   WBC 6.6 09/16/2021 1451   RBC 4.84 09/16/2021 1451   HGB 14.7 09/16/2021 1451   HCT 43.2 09/16/2021 1451   PLT 321 09/16/2021 1451   MCV 89.3 09/16/2021 1451   MCH 30.4 09/16/2021 1451   MCHC 34.0 09/16/2021 1451   RDW 12.2 09/16/2021 1451   LYMPHSABS 3,434 02/06/2019 0853   MONOABS 0.5 12/29/2017 0308   EOSABS 111 02/06/2019 0853   BASOSABS 68 02/06/2019 0853    Hgb A1C Lab Results  Component Value Date   HGBA1C 5.7 (H) 05/05/2022            Assessment & Plan:   Vaginal Itching:  We will check wet prep for gonorrhea, chlamydia, trichomonas, BV and yeast She declines HIV, RPR and hep C today Continue Monistat OTC  Will follow-up after labs are back with further recommendation and treatment plan   RTC in 4 months for your annual exam [name], NP

## 2022-06-29 NOTE — Patient Instructions (Signed)
Vaginitis  Vaginitis is irritation and swelling of the vagina. Treatment will depend on the cause. What are the causes? It can be caused by: Bacteria. Yeast. A parasite. A virus. Low hormone levels. Bubble baths, scented tampons, and feminine sprays. Other things can change the balance of the yeast and bacteria that live in the vagina. These include: Antibiotic medicines. Not being clean enough. Some birth control methods. Sex. Infection. Diabetes. A weakened body defense system (immune system). What increases the risk? Smoking or being around someone who smokes. Using washes (douches), scented tampons, or scented pads. Wearing tight pants or thong underwear. Using birth control pills or an IUD. Having sex without a condom or having a lot of partners. Having an STI. Using a certain product to kill sperm (nonoxynol-9). Eating foods that are high in sugar. Having diabetes. Having low levels of a female hormone. Having a weakened body defense system. Being pregnant or breastfeeding. What are the signs or symptoms? Fluid coming from the vagina that is not normal. A bad smell. Itching, pain, or swelling. Pain with sex. Pain or burning when you pee (urinate). Sometimes there are no symptoms. How is this treated? Treatment may include: Antibiotic creams or pills. Antifungal medicines. Medicines to ease symptoms if you have a virus. Your sex partner should also be treated. Estrogen medicines. Avoiding scented soaps, sprays, or douches. Stopping use of products that caused irritation and then using a cream to treat symptoms. Follow these instructions at home: Lifestyle Keep the area around your vagina clean and dry. Avoid using soap. Rinse the area with water. Until your doctor says it is okay: Do not use washes for the vagina. Do not use tampons. Do not have sex. Wipe from front to back after going to the bathroom. When your doctor says it is okay, practice safe sex  and use condoms. General instructions Take over-the-counter and prescription medicines only as told by your doctor. If you were prescribed an antibiotic medicine, take or use it as told by your doctor. Do not stop taking or using it even if you start to feel better. Keep all follow-up visits. How is this prevented? Do not use things that can irritate the vagina, such as fabric softeners. Avoid these products if they are scented: Sprays. Detergents. Tampons. Products for cleaning the vagina. Soaps or bubble baths. Let air reach your vagina. To do this: Wear cotton underwear. Do not wear: Underwear while you sleep. Tight pants. Thong underwear. Underwear or nylons without a cotton panel. Take off any wet clothing, such as bathing suits, as soon as you can. Practice safe sex and use condoms. Contact a doctor if: You have pain in your belly or in the area between your hips. You have a fever or chills. Your symptoms last for more than 2-3 days. Get help right away if: You have a fever and your symptoms get worse all of a sudden. Summary Vaginitis is irritation and swelling of the vagina. Treatment will depend on the cause of the condition. Do not use washes or tampons or have sex until your doctor says it is okay. This information is not intended to replace advice given to you by your health care provider. Make sure you discuss any questions you have with your health care provider. Document Revised: 01/02/2020 Document Reviewed: 01/02/2020 Elsevier Patient Education  2023 Elsevier Inc.  

## 2022-07-01 ENCOUNTER — Encounter: Payer: Self-pay | Admitting: Internal Medicine

## 2022-07-01 DIAGNOSIS — R21 Rash and other nonspecific skin eruption: Secondary | ICD-10-CM

## 2022-07-01 LAB — CERVICOVAGINAL ANCILLARY ONLY
Bacterial Vaginitis (gardnerella): POSITIVE — AB
Candida Glabrata: NEGATIVE
Candida Vaginitis: NEGATIVE
Chlamydia: NEGATIVE
Comment: NEGATIVE
Comment: NEGATIVE
Comment: NEGATIVE
Comment: NEGATIVE
Comment: NEGATIVE
Comment: NORMAL
Neisseria Gonorrhea: NEGATIVE
Trichomonas: NEGATIVE

## 2022-07-01 MED ORDER — METRONIDAZOLE 500 MG PO TABS: 500.0000 mg | ORAL_TABLET | Freq: Two times a day (BID) | ORAL | 0 refills | Status: DC

## 2022-07-01 NOTE — Addendum Note (Signed)
Addended by: Lorre Munroe on: 07/01/2022 12:38 PM   Modules accepted: Orders

## 2022-07-23 ENCOUNTER — Encounter: Payer: Self-pay | Admitting: Pharmacist

## 2022-07-25 ENCOUNTER — Ambulatory Visit: Payer: 59 | Admitting: Pharmacist

## 2022-07-27 ENCOUNTER — Ambulatory Visit: Payer: 59 | Admitting: Pharmacist

## 2022-08-02 ENCOUNTER — Other Ambulatory Visit: Payer: Self-pay

## 2022-08-02 ENCOUNTER — Other Ambulatory Visit (HOSPITAL_COMMUNITY)
Admission: RE | Admit: 2022-08-02 | Discharge: 2022-08-02 | Disposition: A | Discharge status: Home / Self Care | Payer: 59 | Source: Ambulatory Visit | Attending: Infectious Disease | Admitting: Infectious Disease

## 2022-08-02 ENCOUNTER — Ambulatory Visit (INDEPENDENT_AMBULATORY_CARE_PROVIDER_SITE_OTHER): Payer: 59 | Admitting: Pharmacist

## 2022-08-02 DIAGNOSIS — Z113 Encounter for screening for infections with a predominantly sexual mode of transmission: Secondary | ICD-10-CM

## 2022-08-02 DIAGNOSIS — Z2981 Encounter for HIV pre-exposure prophylaxis: Secondary | ICD-10-CM | POA: Diagnosis not present

## 2022-08-02 DIAGNOSIS — Z79899 Other long term (current) drug therapy: Secondary | ICD-10-CM

## 2022-08-02 MED ORDER — CABOTEGRAVIR ER 600 MG/3ML IM SUER
600.0000 mg | Freq: Once | INTRAMUSCULAR | Status: AC
  Administered 2022-08-02: 600 mg via INTRAMUSCULAR

## 2022-08-02 NOTE — Progress Notes (Addendum)
HPI: Michele Hodge is a 37 y.o. female who presents to the [facility] for Apretude administration and HIV PrEP follow up.  Patient Active Problem List   Diagnosis Date Noted   Prediabetes 05/05/2022   Mixed hyperlipidemia 09/17/2021   Class 1 obesity due to excess calories with body mass index (BMI) of 32.0 to 32.9 in adult 09/16/2021   ADHD 09/16/2021   Gastroesophageal reflux disease 04/17/2017    Patient's Medications  New Prescriptions   No medications on file  Previous Medications   AMPHETAMINE-DEXTROAMPHETAMINE (ADDERALL) 10 MG TABLET    Take 10 mg by mouth every morning.   ATOMOXETINE (STRATTERA) 60 MG CAPSULE    Take 60 mg by mouth daily.   ATORVASTATIN (LIPITOR) 10 MG TABLET    Take 1 tablet (10 mg total) by mouth daily.   LEVONORGESTREL (MIRENA) 20 MCG/DAY IUD    by Intrauterine route.   METRONIDAZOLE (FLAGYL) 500 MG TABLET    Take 1 tablet (500 mg total) by mouth 2 (two) times daily. Do not drink alcohol while taking this medicine.  Modified Medications   No medications on file  Discontinued Medications   No medications on file    Allergies: No Known Allergies  Past Medical History: Past Medical History:  Diagnosis Date   Healthy adult on routine physical examination     Social History: Social History   Socioeconomic History   Marital status: Single    Spouse name: Not on file   Number of children: Not on file   Years of education: Not on file   Highest education level: Not on file  Occupational History   Not on file  Tobacco Use   Smoking status: Never   Smokeless tobacco: Never  Vaping Use   Vaping Use: Never used  Substance and Sexual Activity   Alcohol use: Yes    Alcohol/week: 1.0 standard drink of alcohol    Types: 1 Glasses of wine per week    Comment: occasional use   Drug use: Yes    Types: Marijuana   Sexual activity: Never  Other Topics Concern   Not on file  Social History Narrative   Not on file   Social  Determinants of Health   Financial Resource Strain: Not on file  Food Insecurity: Not on file  Transportation Needs: Not on file  Physical Activity: Not on file  Stress: Not on file  Social Connections: Not on file    Labs: Lab Results  Component Value Date   HIV1RNAQUANT Not Detected 05/30/2022   HIV1RNAQUANT Not Detected 04/04/2022   HIV1RNAQUANT Not Detected 01/20/2022    RPR and STI Lab Results  Component Value Date   LABRPR NON-REACTIVE 02/17/2021   LABRPR NON-REACTIVE 12/23/2020   LABRPR NON-REACTIVE 02/06/2019   LABRPR Non Reactive 04/21/2017   LABRPR NON REAC 12/26/2016    STI Results GC GC CT CT  06/29/2022 10:31 AM Negative   Negative    11/24/2021  2:24 PM Negative   Negative    09/16/2021  3:06 PM Negative   Negative    02/17/2021  2:41 PM Negative   Negative    12/22/2020  3:20 PM Negative   Negative    02/05/2019 12:00 AM Negative   Negative    04/17/2017  5:00 PM   Negative    12/26/2016 11:52 AM  NOT DETECTED   NOT DETECTED     Hepatitis B Lab Results  Component Value Date   HEPBSAB REACTIVE (A) 02/17/2021  HEPBSAG NON-REACTIVE 02/17/2021   Hepatitis C Lab Results  Component Value Date   HEPCAB NON-REACTIVE 02/17/2021   Hepatitis A Lab Results  Component Value Date   HAV REACTIVE (A) 02/17/2021   Lipids: Lab Results  Component Value Date   CHOL 200 (H) 05/05/2022   TRIG 229 (H) 05/05/2022   HDL 50 05/05/2022   CHOLHDL 4.0 05/05/2022   VLDL 33 (H) 12/26/2016   LDLCALC 115 (H) 05/05/2022    TARGET DATE: 10th of each month   Assessment: Michele Hodge presents today for her Apretude injection and to follow up for HIV PrEP. No issues with past injections. Screened for acute HIV symptoms such as fatigue, muscle aches, rash, sore throat, lymphadenopathy, headache, night sweats, nausea/vomiting/diarrhea, and fever. Denies any symptoms. No known exposures to any STIs and no signs or symptoms of any STIs today. She wished to have STI screening  conducted during today's visit . Patient was offered condoms during the visit which were accepted.   Per [employer] guidelines, a rapid HIV test should be drawn prior to Apretude administration. Due to state shortage of rapid HIV tests, this is temporarily unable to be done. Per decision from [place], we will proceed with Apretude administration at this time without a negative rapid HIV test beforehand. HIV RNA was collected today and is in process.  Administered cabotegravir 600mg /43mL in left upper outer quadrant of the gluteal muscle. Injection was tolerated well without issue. Will see her back in 2 months for injection, labs, and HIV PrEP follow up.  Plan: - Apretude injection administered - HIV RNA today - F/U urine and oral cytologies, RPR   - Next injection, labs, and PrEP follow up appointment scheduled for 09/28/22 with Cassie  - Call with any issues or questions  ADDENDUM:  The lab was unable to obtain enough blood to perform lab work. She has had a separate LAB appointment scheduled for 08/05/22 to attempt to redraw labs.   Adria Dill, PharmD PGY-2 Infectious Diseases Resident  08/02/2022 3:05 PM

## 2022-08-03 LAB — URINE CYTOLOGY ANCILLARY ONLY
Chlamydia: NEGATIVE
Comment: NEGATIVE
Comment: NEGATIVE
Comment: NORMAL
Neisseria Gonorrhea: NEGATIVE
Trichomonas: NEGATIVE

## 2022-08-03 LAB — CYTOLOGY, (ORAL, ANAL, URETHRAL) ANCILLARY ONLY
Chlamydia: NEGATIVE
Comment: NEGATIVE
Comment: NORMAL
Neisseria Gonorrhea: NEGATIVE

## 2022-08-03 NOTE — Progress Notes (Signed)
I have reviewed the patient with our PGY2 ID pharmacy resident, Liane Comber, and agree with his assessment and plan as outlined in the note below.  Sharyl Panchal L. Angeliah Wisdom, PharmD, BCIDP, [place], CPP Clinical Pharmacist Practitioner [facility] for [facility] 08/03/2022, 4:18 PM

## 2022-08-04 ENCOUNTER — Other Ambulatory Visit (HOSPITAL_COMMUNITY): Payer: Self-pay

## 2022-08-05 ENCOUNTER — Other Ambulatory Visit: Payer: Self-pay

## 2022-08-05 ENCOUNTER — Other Ambulatory Visit: Payer: 59

## 2022-08-05 DIAGNOSIS — Z113 Encounter for screening for infections with a predominantly sexual mode of transmission: Secondary | ICD-10-CM

## 2022-08-05 DIAGNOSIS — Z79899 Other long term (current) drug therapy: Secondary | ICD-10-CM

## 2022-08-08 LAB — HIV-1 RNA QUANT-NO REFLEX-BLD
HIV 1 RNA Quant: NOT DETECTED Copies/mL
HIV-1 RNA Quant, Log: NOT DETECTED Log cps/mL

## 2022-08-08 LAB — RPR: RPR Ser Ql: NONREACTIVE

## 2022-08-22 ENCOUNTER — Ambulatory Visit
Admission: EM | Admit: 2022-08-22 | Discharge: 2022-08-22 | Disposition: A | Discharge status: Home / Self Care | Payer: 59 | Attending: Nurse Practitioner | Admitting: Nurse Practitioner

## 2022-08-22 DIAGNOSIS — J45909 Unspecified asthma, uncomplicated: Secondary | ICD-10-CM | POA: Diagnosis not present

## 2022-08-22 DIAGNOSIS — B349 Viral infection, unspecified: Secondary | ICD-10-CM | POA: Insufficient documentation

## 2022-08-22 DIAGNOSIS — Z1152 Encounter for screening for COVID-19: Secondary | ICD-10-CM | POA: Diagnosis not present

## 2022-08-22 DIAGNOSIS — J029 Acute pharyngitis, unspecified: Secondary | ICD-10-CM | POA: Insufficient documentation

## 2022-08-22 LAB — POCT RAPID STREP A (OFFICE): Rapid Strep A Screen: NEGATIVE

## 2022-08-22 NOTE — ED Provider Notes (Signed)
[facility]    CSN: [id number] Arrival date & time: 08/22/22  1346      History   Chief Complaint Chief Complaint  Patient presents with   Sore Throat   Cough   Headache    HPI Michele Hodge is a 37 y.o. female The patient is a 37 y.o. female who presents for evaluation of URI symptoms for 1-2 days. Patient reports associated symptoms of sore throat, headache, congestion, and chills. Denies N/V/D, cough, ear pain, body aches, shortness of breath. 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  She is an albuterol inhaler but has not had to use since symptom onset.  No smoking. No known sick contacts and no recent travel. Pt is vaccinated for COVID. Pt is not vaccinated for flu this season. Pt has taken a ibuprofen and Tylenol OTC for symptoms. Pt has no other concerns at this time.    Sore Throat Associated symptoms include headaches.  Cough Associated symptoms: chills, headaches and sore throat   Headache Associated symptoms: congestion and sore throat     Past Medical History:  Diagnosis Date   Healthy adult on routine physical examination     Patient Active Problem List   Diagnosis Date Noted   Prediabetes 05/05/2022   Mixed hyperlipidemia 09/17/2021   Class 1 obesity due to excess calories with body mass index (BMI) of 32.0 to 32.9 in adult 09/16/2021   ADHD 09/16/2021   Gastroesophageal reflux disease 04/17/2017    No past surgical history on file.  OB History   No obstetric history on file.      Home Medications    Prior to Admission medications   Medication Sig Start Date End Date Taking? Authorizing Provider  amphetamine-dextroamphetamine (ADDERALL) 10 MG tablet Take 10 mg by mouth every morning. 11/26/20   [provider]  atomoxetine (STRATTERA) 60 MG capsule Take 60 mg by mouth daily. 12/01/20   [provider]  atorvastatin (LIPITOR) 10 MG tablet Take 1 tablet (10 mg total) by mouth daily. 06/28/22   Jearld Fenton,  NP  levonorgestrel (MIRENA) 20 MCG/DAY IUD by Intrauterine route.    [provider]  metroNIDAZOLE (FLAGYL) 500 MG tablet Take 1 tablet (500 mg total) by mouth 2 (two) times daily. Do not drink alcohol while taking this medicine. 07/01/22   Jearld Fenton, NP    Family History Family History  Problem Relation Age of Onset   Diabetes Father 30   Stroke Maternal Grandmother    CAD Maternal Grandfather    Stroke Paternal Grandmother    Breast cancer Cousin    Schizophrenia Brother 45   Colon cancer Neg Hx    Ovarian cancer Neg Hx     Social History Social History   Tobacco Use   Smoking status: Never   Smokeless tobacco: Never  Vaping Use   Vaping Use: Never used  Substance Use Topics   Alcohol use: Yes    Alcohol/week: 1.0 standard drink of alcohol    Types: 1 Glasses of wine per week    Comment: occasional use   Drug use: Not Currently    Types: Marijuana     Allergies   Patient has no known allergies.   Review of Systems Review of Systems  Constitutional:  Positive for chills.  HENT:  Positive for congestion and sore throat.   Neurological:  Positive for headaches.     Physical Exam Triage Vital Signs ED Triage Vitals  Enc Vitals Group  BP 08/22/22 1413 101/75     Pulse Rate 08/22/22 1413 (!) 110     Resp 08/22/22 1413 18     Temp 08/22/22 1413 98.5 F (36.9 C)     Temp Source 08/22/22 1413 Oral     SpO2 08/22/22 1413 96 %     Weight --      Height --      Head Circumference --      Peak Flow --      Pain Score 08/22/22 1411 5     Pain Loc --      Pain Edu? --      Excl. in [place]? --    No data found.  Updated Vital Signs BP 101/75 (BP Location: Left Arm)   Pulse (!) 110   Temp 98.5 F (36.9 C) (Oral)   Resp 18   SpO2 96%   Visual Acuity Right Eye Distance:   Left Eye Distance:   Bilateral Distance:    Right Eye Near:   Left Eye Near:    Bilateral Near:     Physical Exam Vitals and nursing note reviewed.   Constitutional:      General: She is not in acute distress.    Appearance: She is well-developed. She is not ill-appearing.  HENT:     Head: Normocephalic and atraumatic.     Right Ear: Tympanic membrane and ear canal normal.     Left Ear: Tympanic membrane and ear canal normal.     Nose: Congestion present.     Mouth/Throat:     Mouth: Mucous membranes are moist.     Pharynx: Oropharynx is clear. Uvula midline. Posterior oropharyngeal erythema and uvula swelling present.     Tonsils: No tonsillar exudate or tonsillar abscesses. 2+ on the right. 2+ on the left.     Comments: Airways patent Eyes:     Conjunctiva/sclera: Conjunctivae normal.     Pupils: Pupils are equal, round, and reactive to light.  Cardiovascular:     Rate and Rhythm: Normal rate and regular rhythm.     Heart sounds: Normal heart sounds.  Pulmonary:     Effort: Pulmonary effort is normal.     Breath sounds: Normal breath sounds.  Musculoskeletal:     Cervical back: Normal range of motion and neck supple.  Lymphadenopathy:     Cervical: No cervical adenopathy.  Skin:    General: Skin is warm and dry.  Neurological:     General: No focal deficit present.     Mental Status: She is alert and oriented to person, place, and time.  Psychiatric:        Mood and Affect: Mood normal.        Behavior: Behavior normal.    Centor criteria   Tonsillar exudates 0  Tender anterior cervical lymphadenopathy 0  Fever 0  Absence of cough 1  The Centor criteria are used to determine the likelihood of GAS in adults. One point is given for each criterion;  the likelihood of GAS pharyngitis increases as total points rise.  We generally test for GAS in patients with ?3 Centor criteria; patients with Centor criteria <3 are unlikely to have GAS pharyngitis     UC Treatments / Results  Labs (all labs ordered are listed, but only abnormal results are displayed) Labs Reviewed  CULTURE, GROUP A STREP ([place])  SARS CORONAVIRUS  2 (TAT 6-24 HRS)  POCT RAPID STREP A (OFFICE)    EKG   Radiology No results  found.  Procedures Procedures (including critical care time)  Medications Ordered in UC Medications - No data to display  Initial Impression / Assessment and Plan / UC Course  I have reviewed the triage vital signs and the nursing notes.  Pertinent labs & imaging results that were available during my care of the patient were reviewed by me and considered in my medical decision making (see chart for details).     Reviewed exam and symptoms with patient.  No red flags on exam. Negative rapid strep will culture COVID PCR and will contact if positive Rest and fluids OTC analgesics as needed Follow-up with PCP in 2 to 3 days for recheck ER precautions reviewed and patient verbalized understanding Final Clinical Impressions(s) / UC Diagnoses   Final diagnoses:  Sore throat  Viral illness     Discharge Instructions      Your symptoms and exam are consistent for a viral illness. Please treat your symptoms with over the counter cough medication, tylenol or ibuprofen, humidifier, and rest. Viral illnesses can last 7-14 days. Please follow up with your PCP if your symptoms are not improving. Please go to the ER for any worsening symptoms. This includes but is not limited to fever you can not control with tylenol or ibuprofen, you are not able to stay hydrated, you have shortness of breath or chest pain.  Thank you for choosing [facility] for your healthcare needs. I hope you feel better soon!      ED Prescriptions   None    PDMP not reviewed this encounter.   Melynda Ripple, NP 08/22/22 1435

## 2022-08-22 NOTE — ED Triage Notes (Signed)
Pt c/o sore throat, cough, headache and feeling feverish.  Started: yesterday  Home interventions: acetaminophen, motrin

## 2022-08-22 NOTE — Discharge Instructions (Addendum)
Your symptoms and exam are consistent for a viral illness. Please treat your symptoms with over the counter cough medication, tylenol or ibuprofen, humidifier, and rest. Viral illnesses can last 7-14 days. Please follow up with your PCP if your symptoms are not improving. Please go to the ER for any worsening symptoms. This includes but is not limited to fever you can not control with tylenol or ibuprofen, you are not able to stay hydrated, you have shortness of breath or chest pain.  Thank you for choosing [facility] for your healthcare needs. I hope you feel better soon!  

## 2022-08-23 LAB — CULTURE, GROUP A STREP (THRC)

## 2022-08-23 LAB — SARS CORONAVIRUS 2 (TAT 6-24 HRS): SARS Coronavirus 2: NEGATIVE

## 2022-08-25 ENCOUNTER — Telehealth (HOSPITAL_COMMUNITY): Payer: Self-pay | Admitting: Emergency Medicine

## 2022-08-25 ENCOUNTER — Encounter: Payer: Self-pay | Admitting: Internal Medicine

## 2022-08-25 MED ORDER — AMOXICILLIN 500 MG PO CAPS: 500.0000 mg | ORAL_CAPSULE | Freq: Two times a day (BID) | ORAL | 0 refills | Status: AC

## 2022-09-02 ENCOUNTER — Encounter: Payer: Self-pay | Admitting: Internal Medicine

## 2022-09-28 ENCOUNTER — Ambulatory Visit: Payer: 59 | Admitting: Pharmacist

## 2022-09-28 ENCOUNTER — Encounter: Payer: Self-pay | Admitting: Pharmacist

## 2022-09-28 ENCOUNTER — Other Ambulatory Visit: Payer: Self-pay

## 2022-09-28 ENCOUNTER — Other Ambulatory Visit (HOSPITAL_COMMUNITY)
Admission: RE | Admit: 2022-09-28 | Discharge: 2022-09-28 | Disposition: A | Discharge status: Home / Self Care | Payer: 59 | Source: Ambulatory Visit | Attending: Infectious Disease | Admitting: Infectious Disease

## 2022-09-28 ENCOUNTER — Ambulatory Visit (INDEPENDENT_AMBULATORY_CARE_PROVIDER_SITE_OTHER): Payer: 59 | Admitting: Pharmacist

## 2022-09-28 DIAGNOSIS — Z113 Encounter for screening for infections with a predominantly sexual mode of transmission: Secondary | ICD-10-CM | POA: Diagnosis present

## 2022-09-28 DIAGNOSIS — Z2981 Encounter for HIV pre-exposure prophylaxis: Secondary | ICD-10-CM | POA: Diagnosis not present

## 2022-09-28 DIAGNOSIS — Z79899 Other long term (current) drug therapy: Secondary | ICD-10-CM

## 2022-09-28 MED ORDER — CABOTEGRAVIR ER 600 MG/3ML IM SUER
600.0000 mg | Freq: Once | INTRAMUSCULAR | Status: AC
  Administered 2022-09-28: 600 mg via INTRAMUSCULAR

## 2022-09-28 NOTE — Progress Notes (Signed)
HPI: Michele Hodge is a 37 y.o. female who presents to the [facility] clinic for Apretude administration and HIV PrEP follow up.  Patient Active Problem List   Diagnosis Date Noted   Prediabetes 05/05/2022   Mixed hyperlipidemia 09/17/2021   Class 1 obesity due to excess calories with body mass index (BMI) of 32.0 to 32.9 in adult 09/16/2021   ADHD 09/16/2021   Gastroesophageal reflux disease 04/17/2017    Patient's Medications  New Prescriptions   No medications on file  Previous Medications   AMPHETAMINE-DEXTROAMPHETAMINE (ADDERALL) 10 MG TABLET    Take 10 mg by mouth every morning.   ATOMOXETINE (STRATTERA) 60 MG CAPSULE    Take 60 mg by mouth daily.   ATORVASTATIN (LIPITOR) 10 MG TABLET    Take 1 tablet (10 mg total) by mouth daily.   LEVONORGESTREL (MIRENA) 20 MCG/DAY IUD    by Intrauterine route.   METRONIDAZOLE (FLAGYL) 500 MG TABLET    Take 1 tablet (500 mg total) by mouth 2 (two) times daily. Do not drink alcohol while taking this medicine.  Modified Medications   No medications on file  Discontinued Medications   No medications on file    Allergies: No Known Allergies  Past Medical History: Past Medical History:  Diagnosis Date   Healthy adult on routine physical examination     Social History: Social History   Socioeconomic History   Marital status: Single    Spouse name: Not on file   Number of children: Not on file   Years of education: Not on file   Highest education level: Not on file  Occupational History   Not on file  Tobacco Use   Smoking status: Never   Smokeless tobacco: Never  Vaping Use   Vaping Use: Never used  Substance and Sexual Activity   Alcohol use: Yes    Alcohol/week: 1.0 standard drink of alcohol    Types: 1 Glasses of wine per week    Comment: occasional use   Drug use: Not Currently    Types: Marijuana   Sexual activity: Never  Other Topics Concern   Not on file  Social History Narrative   Not on file   Social  Determinants of Health   Financial Resource Strain: Not on file  Food Insecurity: Not on file  Transportation Needs: Not on file  Physical Activity: Not on file  Stress: Not on file  Social Connections: Not on file    Labs: Lab Results  Component Value Date   HIV1RNAQUANT Not Detected 08/05/2022   HIV1RNAQUANT Not Detected 05/30/2022   HIV1RNAQUANT Not Detected 04/04/2022    RPR and STI Lab Results  Component Value Date   LABRPR NON-REACTIVE 08/05/2022   LABRPR NON-REACTIVE 02/17/2021   LABRPR NON-REACTIVE 12/23/2020   LABRPR NON-REACTIVE 02/06/2019   LABRPR Non Reactive 04/21/2017    STI Results GC GC CT CT  08/02/2022  3:13 PM Negative    Negative   Negative    Negative    06/29/2022 10:31 AM Negative   Negative    11/24/2021  2:24 PM Negative   Negative    09/16/2021  3:06 PM Negative   Negative    02/17/2021  2:41 PM Negative   Negative    12/22/2020  3:20 PM Negative   Negative    02/05/2019 12:00 AM Negative   Negative    04/17/2017  5:00 PM   Negative    12/26/2016 11:52 AM  NOT DETECTED   NOT DETECTED  Hepatitis B Lab Results  Component Value Date   HEPBSAB REACTIVE (A) 02/17/2021   HEPBSAG NON-REACTIVE 02/17/2021   Hepatitis C Lab Results  Component Value Date   HEPCAB NON-REACTIVE 02/17/2021   Hepatitis A Lab Results  Component Value Date   HAV REACTIVE (A) 02/17/2021   Lipids: Lab Results  Component Value Date   CHOL 200 (H) 05/05/2022   TRIG 229 (H) 05/05/2022   HDL 50 05/05/2022   CHOLHDL 4.0 05/05/2022   VLDL 33 (H) 12/26/2016   LDLCALC 115 (H) 05/05/2022    TARGET DATE: The 10th of the month  Current PrEP Regimen: Apretude  Assessment: Michele Hodge presents today for their Apretude injection and to follow up for HIV PrEP. No issues with past injections. No known exposures to any STIs and no signs or symptoms of any STIs today. Screened patient for acute HIV symptoms such as fatigue, muscle aches, rash, sore throat, lymphadenopathy,  headache, night sweats, nausea/vomiting/diarrhea, and fever. Patient denies any symptoms. No new partners since last injection. Will check HIV RNA today along with urine/oral cytologies and RPR .  Per [employer] guidelines, a rapid HIV test should be drawn prior to Apretude administration. Due to state shortage of rapid HIV tests, this is temporarily unable to be done. Per decision from [place], we will proceed with Apretude administration at this time without a negative rapid HIV test beforehand. HIV RNA was collected today and is in process.  Administered cabotegravir '600mg'$ /18m in left upper outer quadrant of the gluteal muscle. Will make follow up appointments for maintenance injections every 2 months.   Plan: - Maintenance injections scheduled for 5/6 with Cassie - Check HIV RNA, RPR, and urine/oral cytologies - Call with any issues or questions  AAlfonse Spruce PharmD, CPP, BCIDP, ASan MarcosClinical Pharmacist Practitioner Infectious [facility] Infect[facility]

## 2022-09-29 LAB — CYTOLOGY, (ORAL, ANAL, URETHRAL) ANCILLARY ONLY
Chlamydia: NEGATIVE
Comment: NEGATIVE
Comment: NORMAL
Neisseria Gonorrhea: NEGATIVE

## 2022-09-29 LAB — URINE CYTOLOGY ANCILLARY ONLY
Chlamydia: NEGATIVE
Comment: NEGATIVE
Comment: NORMAL
Neisseria Gonorrhea: NEGATIVE

## 2022-09-30 LAB — HIV-1 RNA QUANT-NO REFLEX-BLD
HIV 1 RNA Quant: NOT DETECTED Copies/mL
HIV-1 RNA Quant, Log: NOT DETECTED Log cps/mL

## 2022-09-30 LAB — RPR: RPR Ser Ql: NONREACTIVE

## 2022-10-21 ENCOUNTER — Encounter: Payer: Self-pay | Admitting: Internal Medicine

## 2022-10-21 ENCOUNTER — Other Ambulatory Visit: Payer: Self-pay | Admitting: Internal Medicine

## 2022-10-21 MED ORDER — ATORVASTATIN CALCIUM 10 MG PO TABS: 10.0000 mg | ORAL_TABLET | Freq: Every day | ORAL | 0 refills | Status: DC

## 2022-10-21 NOTE — Telephone Encounter (Signed)
Requested Prescriptions  Pending Prescriptions Disp Refills   atorvastatin (LIPITOR) 10 MG tablet [Pharmacy Med Name: Atorvastatin Calcium 10 MG Oral Tablet] 90 tablet 0    Sig: Take 1 tablet by mouth once daily     Cardiovascular:  Antilipid - Statins Failed - 10/21/2022  2:55 PM      Failed - Lipid Panel in normal range within the last 12 months    Cholesterol  Date Value Ref Range Status  05/05/2022 200 (H) <200 mg/dL Final   LDL Cholesterol (Calc)  Date Value Ref Range Status  05/05/2022 115 (H) mg/dL (calc) Final    Comment:    Reference range: <100 . Desirable range <100 mg/dL for primary prevention;   <70 mg/dL for patients with CHD or diabetic patients  with > or = 2 CHD risk factors. Marland Kitchen LDL-C is now calculated using the Martin-Hopkins  calculation, which is a validated novel method providing  better accuracy than the Friedewald equation in the  estimation of LDL-C.  Horald Pollen et al. Lenox Ahr. 9485;462(70): 2061-2068  (http://education.Quest[employer].com/faq/FAQ164)    HDL  Date Value Ref Range Status  05/05/2022 50 > OR = 50 mg/dL Final   Triglycerides  Date Value Ref Range Status  05/05/2022 229 (H) <150 mg/dL Final    Comment:    . If a non-fasting specimen was collected, consider repeat triglyceride testing on a fasting specimen if clinically indicated.  Perry Mount et al. J. of Clin. Lipidol. 2015;9:129-169. Marland Kitchen          Passed - Patient is not pregnant      Passed - Valid encounter within last 12 months    Recent Outpatient Visits           3 months ago Vaginal itching   [facility] [place], Salvadore Oxford, NP   5 months ago Hives   [facility] [place], Salvadore Oxford, NP   1 year ago Encounter for general adult medical examination with abnormal findings   [facility] [place], Salvadore Oxford, NP   1 year ago IUD migration, initial encounter   [facility] [place],  Salvadore Oxford, NP   3 years ago Encounter for routine adult physical exam with abnormal findings   [facility] [place], Alison Stalling, NP       Future Appointments             In 8 months Deirdre Evener, MD [facility]

## 2022-10-21 NOTE — Telephone Encounter (Signed)
Last labs 05/05/22 patient needs OV with labs for refills.

## 2022-10-26 ENCOUNTER — Encounter: Payer: Self-pay | Admitting: Pharmacist

## 2022-11-07 ENCOUNTER — Other Ambulatory Visit (HOSPITAL_COMMUNITY): Payer: Self-pay

## 2022-11-07 ENCOUNTER — Telehealth: Payer: Self-pay

## 2022-11-07 NOTE — Telephone Encounter (Signed)
[facility] Patient Advocate Encounter  Apretude [phone])     [facility] Portal  [employer]  Authorization # [id number]         Clearance Coots, CPhT Specialty Pharmacy Patient [facility] for Infectious Disease Phone: [phone] Fax:  [phone]

## 2022-11-17 ENCOUNTER — Other Ambulatory Visit (HOSPITAL_COMMUNITY): Payer: Self-pay

## 2022-11-18 ENCOUNTER — Other Ambulatory Visit: Payer: Self-pay

## 2022-11-18 ENCOUNTER — Ambulatory Visit (INDEPENDENT_AMBULATORY_CARE_PROVIDER_SITE_OTHER): Payer: 59 | Admitting: Pharmacist

## 2022-11-18 DIAGNOSIS — Z79899 Other long term (current) drug therapy: Secondary | ICD-10-CM

## 2022-11-18 DIAGNOSIS — Z113 Encounter for screening for infections with a predominantly sexual mode of transmission: Secondary | ICD-10-CM

## 2022-11-18 DIAGNOSIS — Z2981 Encounter for HIV pre-exposure prophylaxis: Secondary | ICD-10-CM

## 2022-11-18 MED ORDER — CABOTEGRAVIR ER 600 MG/3ML IM SUER
600.0000 mg | Freq: Once | INTRAMUSCULAR | Status: AC
  Administered 2022-11-18: 600 mg via INTRAMUSCULAR

## 2022-11-18 NOTE — Progress Notes (Signed)
HPI: Michele Hodge is a 37 y.o. female who presents to the [facility] pharmacy clinic for Apretude administration and HIV PrEP follow up.  Patient Active Problem List   Diagnosis Date Noted   Prediabetes 05/05/2022   Mixed hyperlipidemia 09/17/2021   Class 1 obesity due to excess calories with body mass index (BMI) of 32.0 to 32.9 in adult 09/16/2021   ADHD 09/16/2021   Gastroesophageal reflux disease 04/17/2017    Patient's Medications  New Prescriptions   No medications on file  Previous Medications   AMPHETAMINE-DEXTROAMPHETAMINE (ADDERALL) 10 MG TABLET    Take 10 mg by mouth every morning.   ATOMOXETINE (STRATTERA) 60 MG CAPSULE    Take 60 mg by mouth daily.   ATORVASTATIN (LIPITOR) 10 MG TABLET    Take 1 tablet by mouth once daily   ATORVASTATIN (LIPITOR) 10 MG TABLET    Take 1 tablet (10 mg total) by mouth daily. Please schedule a physical before anymore refills.   LEVONORGESTREL (MIRENA) 20 MCG/DAY IUD    by Intrauterine route.   METRONIDAZOLE (FLAGYL) 500 MG TABLET    Take 1 tablet (500 mg total) by mouth 2 (two) times daily. Do not drink alcohol while taking this medicine.  Modified Medications   No medications on file  Discontinued Medications   No medications on file    Allergies: No Known Allergies  Past Medical History: Past Medical History:  Diagnosis Date   Healthy adult on routine physical examination     Social History: Social History   Socioeconomic History   Marital status: Single    Spouse name: Not on file   Number of children: Not on file   Years of education: Not on file   Highest education level: Not on file  Occupational History   Not on file  Tobacco Use   Smoking status: Never   Smokeless tobacco: Never  Vaping Use   Vaping Use: Never used  Substance and Sexual Activity   Alcohol use: Yes    Alcohol/week: 1.0 standard drink of alcohol    Types: 1 Glasses of wine per week    Comment: occasional use   Drug use: Not Currently    Types:  Marijuana   Sexual activity: Never  Other Topics Concern   Not on file  Social History Narrative   Not on file   Social Determinants of Health   Financial Resource Strain: Not on file  Food Insecurity: Not on file  Transportation Needs: Not on file  Physical Activity: Not on file  Stress: Not on file  Social Connections: Not on file    Labs: Lab Results  Component Value Date   HIV1RNAQUANT Not Detected 09/28/2022   HIV1RNAQUANT Not Detected 08/05/2022   HIV1RNAQUANT Not Detected 05/30/2022    RPR and STI Lab Results  Component Value Date   LABRPR NON-REACTIVE 09/28/2022   LABRPR NON-REACTIVE 08/05/2022   LABRPR NON-REACTIVE 02/17/2021   LABRPR NON-REACTIVE 12/23/2020   LABRPR NON-REACTIVE 02/06/2019    STI Results GC GC CT CT  09/28/2022  2:21 PM Negative    Negative   Negative    Negative    08/02/2022  3:13 PM Negative    Negative   Negative    Negative    06/29/2022 10:31 AM Negative   Negative    11/24/2021  2:24 PM Negative   Negative    09/16/2021  3:06 PM Negative   Negative    02/17/2021  2:41 PM Negative   Negative  12/22/2020  3:20 PM Negative   Negative    02/05/2019 12:00 AM Negative   Negative    04/17/2017  5:00 PM   Negative    12/26/2016 11:52 AM  NOT DETECTED   NOT DETECTED     Hepatitis B Lab Results  Component Value Date   HEPBSAB REACTIVE (A) 02/17/2021   HEPBSAG NON-REACTIVE 02/17/2021   Hepatitis C Lab Results  Component Value Date   HEPCAB NON-REACTIVE 02/17/2021   Hepatitis A Lab Results  Component Value Date   HAV REACTIVE (A) 02/17/2021   Lipids: Lab Results  Component Value Date   CHOL 200 (H) 05/05/2022   TRIG 229 (H) 05/05/2022   HDL 50 05/05/2022   CHOLHDL 4.0 05/05/2022   VLDL 33 (H) 12/26/2016   LDLCALC 115 (H) 05/05/2022    TARGET DATE: The 10th of the month  Current PrEP Regimen: Apretude  Assessment: Michele Hodge presents today for their Apretude injection and to follow up for HIV PrEP. No issues with  past injections. No known exposures to any STIs and no signs or symptoms of any STIs today. Screened patient for acute HIV symptoms such as fatigue, muscle aches, rash, sore throat, lymphadenopathy, headache, night sweats, nausea/vomiting/diarrhea, and fever. Patient denies any symptoms. No new partners since last injection. Will check HIV RNA today. Patient did not need STI testing today. Discussed newest COVID booster and patient declined with possibility insurance may not cover.  Per [employer] guidelines, a rapid HIV test should be drawn prior to Apretude administration. Due to state shortage of rapid HIV tests, this is temporarily unable to be done. Per decision from [facility] physicians, we will proceed with Apretude administration at this time without a negative rapid HIV test beforehand. HIV RNA was collected today and is in process.  Administered cabotegravir 600mg /53mL in left upper outer quadrant of the gluteal muscle. Will make follow up appointments for maintenance injections every 2 months.   Plan: - Maintenance injections scheduled for 01/24/2023 with Michele Hodge - Check HIV RNA -F/u outpatient pharmacy to check price and administration of COVID booster - Call with any issues or questions  Michele Hodge, PharmD. [facility] PGY-1  11/18/2022 11:59 AM

## 2022-11-19 ENCOUNTER — Encounter: Payer: Self-pay | Admitting: Internal Medicine

## 2022-11-20 LAB — HIV-1 RNA QUANT-NO REFLEX-BLD
HIV 1 RNA Quant: NOT DETECTED Copies/mL
HIV-1 RNA Quant, Log: NOT DETECTED Log cps/mL

## 2022-11-21 ENCOUNTER — Ambulatory Visit: Payer: 59 | Admitting: Pharmacist

## 2022-11-28 ENCOUNTER — Telehealth (INDEPENDENT_AMBULATORY_CARE_PROVIDER_SITE_OTHER): Payer: 59 | Admitting: Internal Medicine

## 2022-11-28 ENCOUNTER — Encounter: Payer: Self-pay | Admitting: Internal Medicine

## 2022-11-28 ENCOUNTER — Other Ambulatory Visit (HOSPITAL_COMMUNITY)
Admission: RE | Admit: 2022-11-28 | Discharge: 2022-11-28 | Disposition: A | Discharge status: Home / Self Care | Payer: 59 | Source: Ambulatory Visit | Attending: Internal Medicine | Admitting: Internal Medicine

## 2022-11-28 DIAGNOSIS — N898 Other specified noninflammatory disorders of vagina: Secondary | ICD-10-CM | POA: Insufficient documentation

## 2022-11-28 DIAGNOSIS — R3915 Urgency of urination: Secondary | ICD-10-CM

## 2022-11-28 DIAGNOSIS — R300 Dysuria: Secondary | ICD-10-CM

## 2022-11-28 DIAGNOSIS — R350 Frequency of micturition: Secondary | ICD-10-CM

## 2022-11-28 LAB — POCT URINALYSIS DIPSTICK
Bilirubin, UA: NEGATIVE
Glucose, UA: NEGATIVE
Ketones, UA: NEGATIVE
Nitrite, UA: NEGATIVE
Protein, UA: POSITIVE — AB
Spec Grav, UA: 1.01 (ref 1.010–1.025)
Urobilinogen, UA: 0.2 E.U./dL
pH, UA: 5 (ref 5.0–8.0)

## 2022-11-28 NOTE — Progress Notes (Signed)
Virtual Visit via Video Note  I connected with Michele Hodge on 11/28/22 at 11:20 AM EDT by a video enabled telemedicine application and verified that I am speaking with the correct person using two identifiers.  Location: Patient: In her car Provider: Office  Persons participating in this video call: [name], NP and Michele Hodge   I discussed the limitations of evaluation and management by telemedicine and the availability of in person appointments. The patient expressed understanding and agreed to proceed.  History of Present Illness:  Patient reports urinary urgency, frequency and dysuria.  This started 2 weeks ago but worsened in the last week.  She also reports vaginal irritation.  She denies vaginal discharge, odor or abnormal vaginal bleeding. She denies pelvic pain, low back pain, fever, chills, nausea or vomiting.  She has changed soaps recently and been using bubble baths. She has tried AZO with some relief of symptoms. She has tried Amoxicillin that was leftover x 1, 2 days ago.    Past Medical History:  Diagnosis Date   Healthy adult on routine physical examination     Current Outpatient Medications  Medication Sig Dispense Refill   amphetamine-dextroamphetamine (ADDERALL) 10 MG tablet Take 10 mg by mouth every morning.     atomoxetine (STRATTERA) 60 MG capsule Take 60 mg by mouth daily.     atorvastatin (LIPITOR) 10 MG tablet Take 1 tablet by mouth once daily 90 tablet 0   atorvastatin (LIPITOR) 10 MG tablet Take 1 tablet (10 mg total) by mouth daily. Please schedule a physical before anymore refills. 30 tablet 0   levonorgestrel (MIRENA) 20 MCG/DAY IUD by Intrauterine route.     metroNIDAZOLE (FLAGYL) 500 MG tablet Take 1 tablet (500 mg total) by mouth 2 (two) times daily. Do not drink alcohol while taking this medicine. 14 tablet 0   No current facility-administered medications for this visit.    No Known Allergies  Family History  Problem Relation Age of  Onset   Diabetes Father 27   Stroke Maternal Grandmother    CAD Maternal Grandfather    Stroke Paternal Grandmother    Breast cancer Cousin    Schizophrenia Brother 20   Colon cancer Neg Hx    Ovarian cancer Neg Hx     Social History   Socioeconomic History   Marital status: Single    Spouse name: Not on file   Number of children: Not on file   Years of education: Not on file   Highest education level: Not on file  Occupational History   Not on file  Tobacco Use   Smoking status: Never   Smokeless tobacco: Never  Vaping Use   Vaping Use: Never used  Substance and Sexual Activity   Alcohol use: Yes    Alcohol/week: 1.0 standard drink of alcohol    Types: 1 Glasses of wine per week    Comment: occasional use   Drug use: Not Currently    Types: Marijuana   Sexual activity: Never  Other Topics Concern   Not on file  Social History Narrative   Not on file   Social Determinants of Health   Financial Resource Strain: Not on file  Food Insecurity: Not on file  Transportation Needs: Not on file  Physical Activity: Not on file  Stress: Not on file  Social Connections: Not on file  Intimate Partner Violence: Not on file     Constitutional: Denies fever, malaise, fatigue, headache or abrupt weight changes.  Respiratory: Denies difficulty  breathing, shortness of breath, cough or sputum production.   Cardiovascular: Denies chest pain, chest tightness, palpitations or swelling in the hands or feet.  Gastrointestinal: Denies abdominal pain, bloating, constipation, diarrhea or blood in the stool.  GU: Patient reports urinary frequency, urgency, dysuria and vaginal irritation.  Denies burning sensation, blood in urine, odor or discharge. Skin: Denies redness, rashes, lesions or ulcercations.   No other specific complaints in a complete review of systems (except as listed in HPI above).  Observations/Objective:  Wt Readings from Last 3 Encounters:  06/29/22 233 lb (105.7  kg)  05/05/22 234 lb (106.1 kg)  09/16/21 220 lb (99.8 kg)    General: Appears her stated age, obese, in NAD. Pulmonary/Chest: No respiratory distress.  Neurological: Alert and oriented.   BMET    Component Value Date/Time   NA 135 05/05/2022 1004   K 4.2 05/05/2022 1004   CL 102 05/05/2022 1004   CO2 26 05/05/2022 1004   GLUCOSE 96 05/05/2022 1004   BUN 11 05/05/2022 1004   CREATININE 0.74 05/05/2022 1004   CALCIUM 9.1 05/05/2022 1004   GFRNONAA 91 02/06/2019 0853   GFRAA 106 02/06/2019 0853    Lipid Panel     Component Value Date/Time   CHOL 200 (H) 05/05/2022 1004   TRIG 229 (H) 05/05/2022 1004   HDL 50 05/05/2022 1004   CHOLHDL 4.0 05/05/2022 1004   VLDL 33 (H) 12/26/2016 0909   LDLCALC 115 (H) 05/05/2022 1004    CBC    Component Value Date/Time   WBC 6.6 09/16/2021 1451   RBC 4.84 09/16/2021 1451   HGB 14.7 09/16/2021 1451   HCT 43.2 09/16/2021 1451   PLT 321 09/16/2021 1451   MCV 89.3 09/16/2021 1451   MCH 30.4 09/16/2021 1451   MCHC 34.0 09/16/2021 1451   RDW 12.2 09/16/2021 1451   LYMPHSABS 3,434 02/06/2019 0853   MONOABS 0.5 12/29/2017 0308   EOSABS 111 02/06/2019 0853   BASOSABS 68 02/06/2019 0853    Hgb A1C Lab Results  Component Value Date   HGBA1C 5.7 (H) 05/05/2022       Assessment and Plan:  Urinary Urgency, Frequency, Dysuria, Vaginal Irritation:  Urinalysis: trace leuks Will send urine culture Will obtain wet prep to check for BV, yeast, gonorrhea, chlamydia and trichomoniasis Push fluids Okay to continue AZO OTC  Schedule an appointment for your annual exam Follow Up Instructions:    I discussed the assessment and treatment plan with the patient. The patient was provided an opportunity to ask questions and all were answered. The patient agreed with the plan and demonstrated an understanding of the instructions.   The patient was advised to call back or seek an in-person evaluation if the symptoms worsen or if the  condition fails to improve as anticipated.    [name], NP

## 2022-11-28 NOTE — Patient Instructions (Signed)

## 2022-11-29 ENCOUNTER — Encounter: Payer: Self-pay | Admitting: Internal Medicine

## 2022-11-30 LAB — URINE CULTURE
MICRO NUMBER:: 14947838
SPECIMEN QUALITY:: ADEQUATE

## 2022-11-30 LAB — CERVICOVAGINAL ANCILLARY ONLY
Bacterial Vaginitis (gardnerella): POSITIVE — AB
Candida Glabrata: NEGATIVE
Candida Vaginitis: NEGATIVE
Chlamydia: NEGATIVE
Comment: NEGATIVE
Comment: NEGATIVE
Comment: NEGATIVE
Comment: NEGATIVE
Comment: NEGATIVE
Comment: NORMAL
Neisseria Gonorrhea: NEGATIVE
Trichomonas: NEGATIVE

## 2022-11-30 MED ORDER — NITROFURANTOIN MONOHYD MACRO 100 MG PO CAPS: 100.0000 mg | ORAL_CAPSULE | Freq: Two times a day (BID) | ORAL | 0 refills | Status: DC

## 2022-11-30 NOTE — Addendum Note (Signed)
Addended by: Lorre Munroe on: 11/30/2022 08:50 AM   Modules accepted: Orders

## 2022-12-10 ENCOUNTER — Encounter: Payer: Self-pay | Admitting: Internal Medicine

## 2023-01-24 ENCOUNTER — Other Ambulatory Visit: Payer: Self-pay

## 2023-01-24 ENCOUNTER — Ambulatory Visit (INDEPENDENT_AMBULATORY_CARE_PROVIDER_SITE_OTHER): Payer: 59 | Admitting: Pharmacist

## 2023-01-24 DIAGNOSIS — Z2981 Encounter for HIV pre-exposure prophylaxis: Secondary | ICD-10-CM

## 2023-01-24 DIAGNOSIS — Z113 Encounter for screening for infections with a predominantly sexual mode of transmission: Secondary | ICD-10-CM

## 2023-01-24 DIAGNOSIS — Z79899 Other long term (current) drug therapy: Secondary | ICD-10-CM

## 2023-01-24 MED ORDER — CABOTEGRAVIR ER 600 MG/3ML IM SUER
600.0000 mg | Freq: Once | INTRAMUSCULAR | Status: AC
  Administered 2023-01-24: 600 mg via INTRAMUSCULAR

## 2023-01-24 NOTE — Progress Notes (Signed)
HPI: Michele Hodge is a 37 y.o. female who presents to the [facility] pharmacy clinic for Apretude administration and HIV PrEP follow up.  Patient Active Problem List   Diagnosis Date Noted   Prediabetes 05/05/2022   Mixed hyperlipidemia 09/17/2021   Class 1 obesity due to excess calories with body mass index (BMI) of 32.0 to 32.9 in adult 09/16/2021   ADHD 09/16/2021   Gastroesophageal reflux disease 04/17/2017    Patient's Medications  New Prescriptions   No medications on file  Previous Medications   AMPHETAMINE-DEXTROAMPHETAMINE (ADDERALL) 10 MG TABLET    Take 10 mg by mouth every morning.   ATOMOXETINE (STRATTERA) 60 MG CAPSULE    Take 60 mg by mouth daily.   ATORVASTATIN (LIPITOR) 10 MG TABLET    Take 1 tablet by mouth once daily   ATORVASTATIN (LIPITOR) 10 MG TABLET    Take 1 tablet (10 mg total) by mouth daily. Please schedule a physical before anymore refills.   LEVONORGESTREL (MIRENA) 20 MCG/DAY IUD    by Intrauterine route.   METRONIDAZOLE (FLAGYL) 500 MG TABLET    Take 1 tablet (500 mg total) by mouth 2 (two) times daily. Do not drink alcohol while taking this medicine.   NITROFURANTOIN, MACROCRYSTAL-MONOHYDRATE, (MACROBID) 100 MG CAPSULE    Take 1 capsule (100 mg total) by mouth 2 (two) times daily.  Modified Medications   No medications on file  Discontinued Medications   No medications on file    Allergies: No Known Allergies  Past Medical History: Past Medical History:  Diagnosis Date   Healthy adult on routine physical examination     Social History: Social History   Socioeconomic History   Marital status: Single    Spouse name: Not on file   Number of children: Not on file   Years of education: Not on file   Highest education level: Not on file  Occupational History   Not on file  Tobacco Use   Smoking status: Never   Smokeless tobacco: Never  Vaping Use   Vaping Use: Never used  Substance and Sexual Activity   Alcohol use: Yes    Alcohol/week:  1.0 standard drink of alcohol    Types: 1 Glasses of wine per week    Comment: occasional use   Drug use: Not Currently    Types: Marijuana   Sexual activity: Never  Other Topics Concern   Not on file  Social History Narrative   Not on file   Social Determinants of Health   Financial Resource Strain: Not on file  Food Insecurity: Not on file  Transportation Needs: Not on file  Physical Activity: Not on file  Stress: Not on file  Social Connections: Not on file    Labs: Lab Results  Component Value Date   HIV1RNAQUANT Not Detected 11/18/2022   HIV1RNAQUANT Not Detected 09/28/2022   HIV1RNAQUANT Not Detected 08/05/2022    RPR and STI Lab Results  Component Value Date   LABRPR NON-REACTIVE 09/28/2022   LABRPR NON-REACTIVE 08/05/2022   LABRPR NON-REACTIVE 02/17/2021   LABRPR NON-REACTIVE 12/23/2020   LABRPR NON-REACTIVE 02/06/2019    STI Results GC GC CT CT  11/28/2022  3:35 PM Negative   Negative    09/28/2022  2:21 PM Negative    Negative   Negative    Negative    08/02/2022  3:13 PM Negative    Negative   Negative    Negative    06/29/2022 10:31 AM Negative   Negative  11/24/2021  2:24 PM Negative   Negative    09/16/2021  3:06 PM Negative   Negative    02/17/2021  2:41 PM Negative   Negative    12/22/2020  3:20 PM Negative   Negative    02/05/2019 12:00 AM Negative   Negative    04/17/2017  5:00 PM   Negative    12/26/2016 11:52 AM  NOT DETECTED   NOT DETECTED     Hepatitis B Lab Results  Component Value Date   HEPBSAB REACTIVE (A) 02/17/2021   HEPBSAG NON-REACTIVE 02/17/2021   Hepatitis C Lab Results  Component Value Date   HEPCAB NON-REACTIVE 02/17/2021   Hepatitis A Lab Results  Component Value Date   HAV REACTIVE (A) 02/17/2021   Lipids: Lab Results  Component Value Date   CHOL 200 (H) 05/05/2022   TRIG 229 (H) 05/05/2022   HDL 50 05/05/2022   CHOLHDL 4.0 05/05/2022   VLDL 33 (H) 12/26/2016   LDLCALC 115 (H) 05/05/2022    TARGET  DATE: The 10th of the month  Current PrEP Regimen: Apretude  Assessment: [name] presents today for their Apretude injection and to follow up for HIV PrEP. No issues with past injections. No known exposures to any STIs and no signs or symptoms of any STIs today.  Screened patient for acute HIV symptoms such as fatigue, muscle aches, rash, sore throat, lymphadenopathy, headache, night sweats, nausea/vomiting/diarrhea, and fever. Patient denies any symptoms. No new partners since last injection; will check RPR at her lab visit.   Per [employer] guidelines, a rapid HIV test should be drawn prior to Apretude administration. Due to state shortage of rapid HIV tests, this is temporarily unable to be done. Per decision from [facility] physicians, we will proceed with Apretude administration at this time without a negative rapid HIV test beforehand.   Administered cabotegravir 600mg /66mL in left upper outer quadrant of the gluteal muscle. Will make follow up appointments for maintenance injections every 2 months.   She was too dehydrated for labs today, so she will return tomorrow for HIV RNA and RPR.   Plan: - Maintenance injections scheduled for 9/3 with Cassie - Lab appointment for HIV RNA and RPR tomorrow at 2:00pm - Call with any issues or questions  Margarite Gouge, PharmD, CPP, BCIDP, AAHIVP Clinical Pharmacist Practitioner Infectious Diseases Clinical Pharmacist [facility]

## 2023-01-25 ENCOUNTER — Other Ambulatory Visit: Payer: Self-pay

## 2023-01-25 ENCOUNTER — Other Ambulatory Visit: Payer: 59

## 2023-01-25 DIAGNOSIS — Z79899 Other long term (current) drug therapy: Secondary | ICD-10-CM

## 2023-01-25 DIAGNOSIS — Z113 Encounter for screening for infections with a predominantly sexual mode of transmission: Secondary | ICD-10-CM

## 2023-01-26 ENCOUNTER — Other Ambulatory Visit: Payer: Self-pay

## 2023-01-26 ENCOUNTER — Other Ambulatory Visit: Payer: 59

## 2023-01-26 DIAGNOSIS — Z79899 Other long term (current) drug therapy: Secondary | ICD-10-CM

## 2023-01-26 DIAGNOSIS — Z113 Encounter for screening for infections with a predominantly sexual mode of transmission: Secondary | ICD-10-CM

## 2023-01-28 LAB — HIV-1 RNA QUANT-NO REFLEX-BLD
HIV 1 RNA Quant: NOT DETECTED Copies/mL
HIV-1 RNA Quant, Log: NOT DETECTED Log cps/mL

## 2023-01-28 LAB — RPR: RPR Ser Ql: NONREACTIVE

## 2023-02-08 IMAGING — US US PELVIS COMPLETE WITH TRANSVAGINAL
1 series · 13 of 25 positions shown · non-contrast
Comparison: None

CLINICAL DATA: IUD the placement, LMP 09/21/2021

EXAM:
TRANSABDOMINAL AND TRANSVAGINAL ULTRASOUND OF PELVIS
TECHNIQUE: Both transabdominal and transvaginal ultrasound examinations of the
pelvis were performed. Transabdominal technique was performed for
global imaging of the pelvis including uterus, ovaries, adnexal
regions, and pelvic cul-de-sac. It was necessary to proceed with
endovaginal exam following the transabdominal exam to visualize the
endometrium, ovaries, and IUD.

[Series 1: us pelvic complete with transvaginal · 13 of 79 slices shown]
[im 1/79]
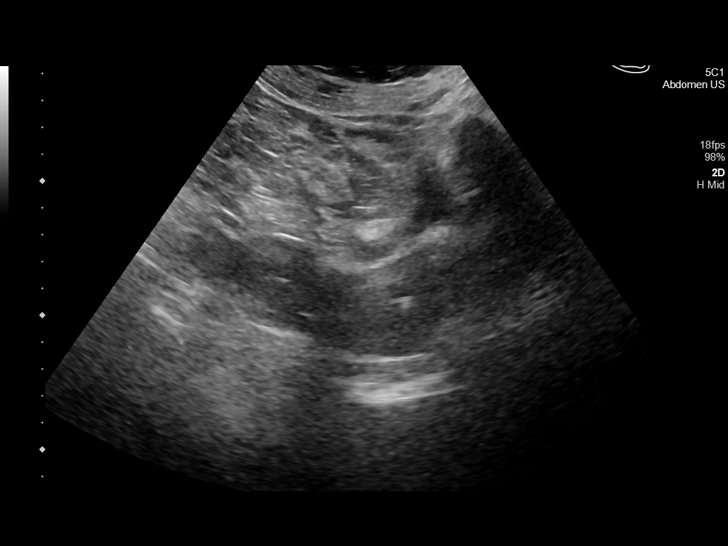
[im 7/79]
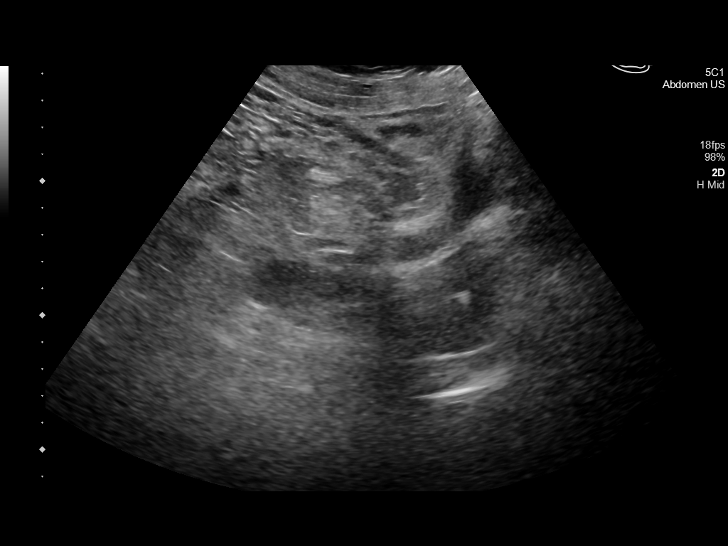
[im 14/79]
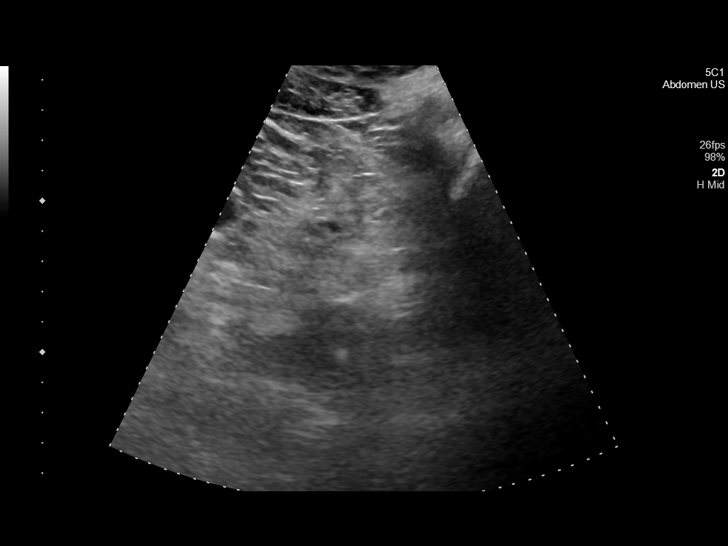
[im 20/79]
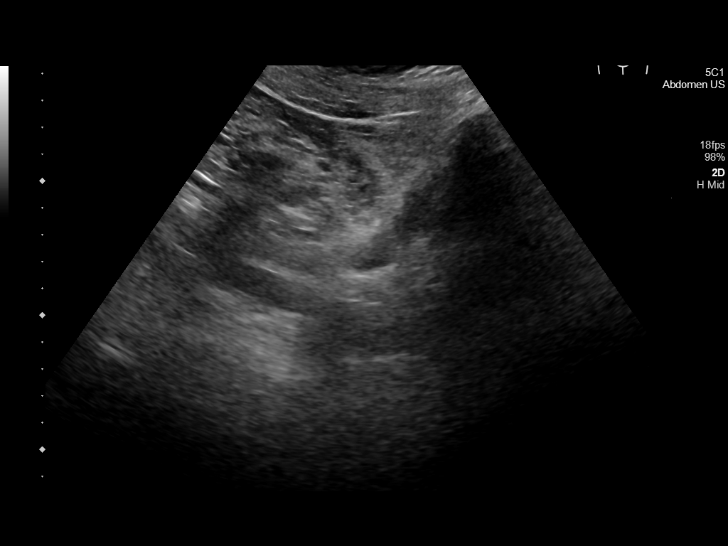
[im 27/79]
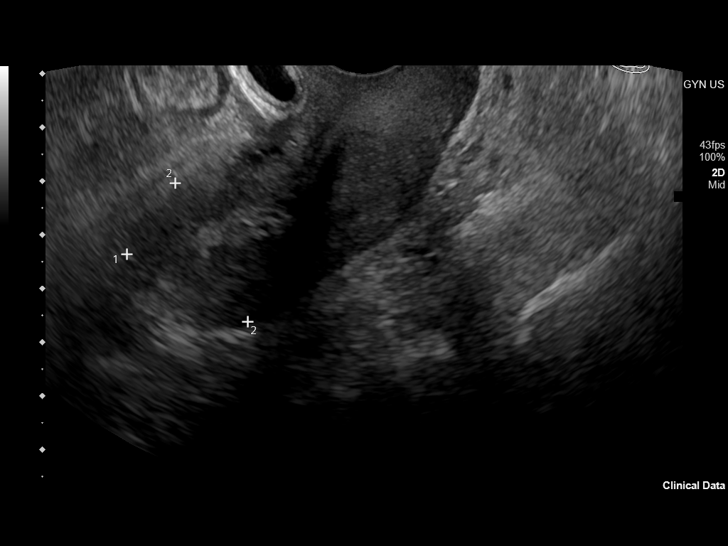
[im 33/79]
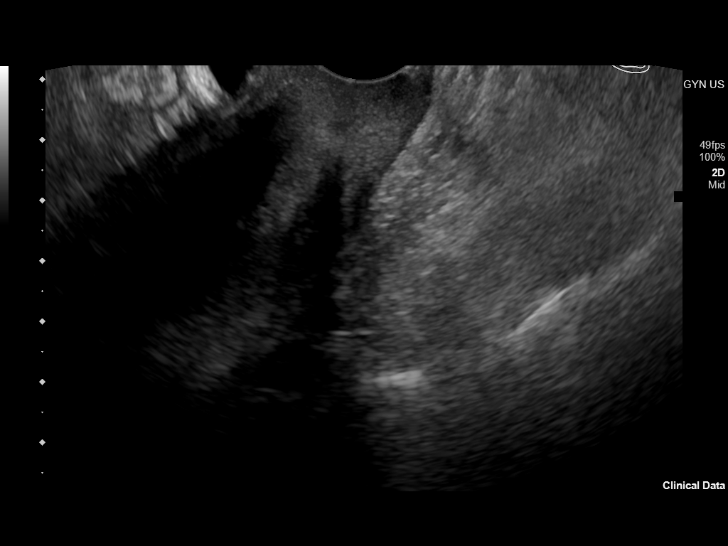
[im 40/79]
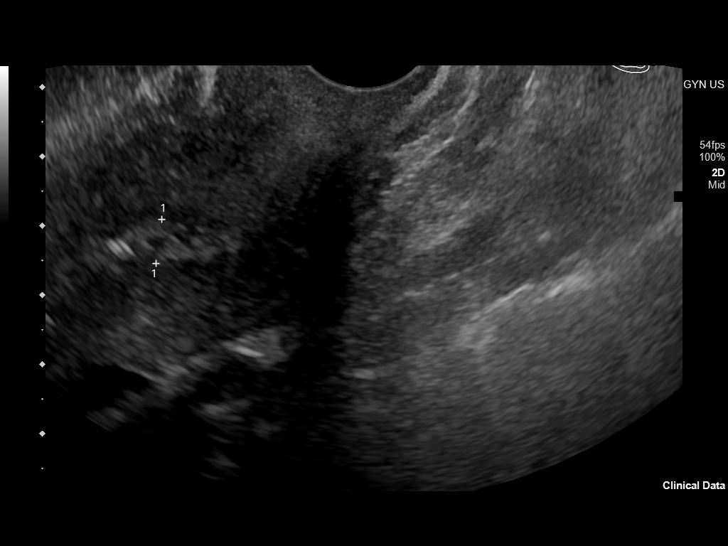
[im 46/79]
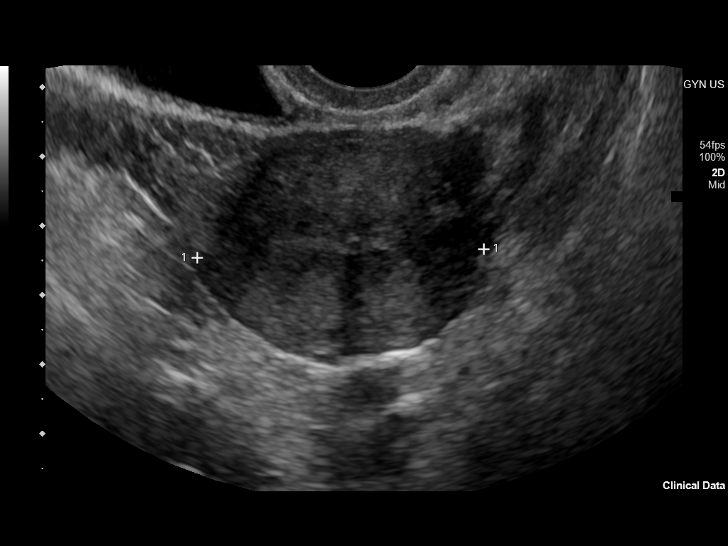
[im 53/79]
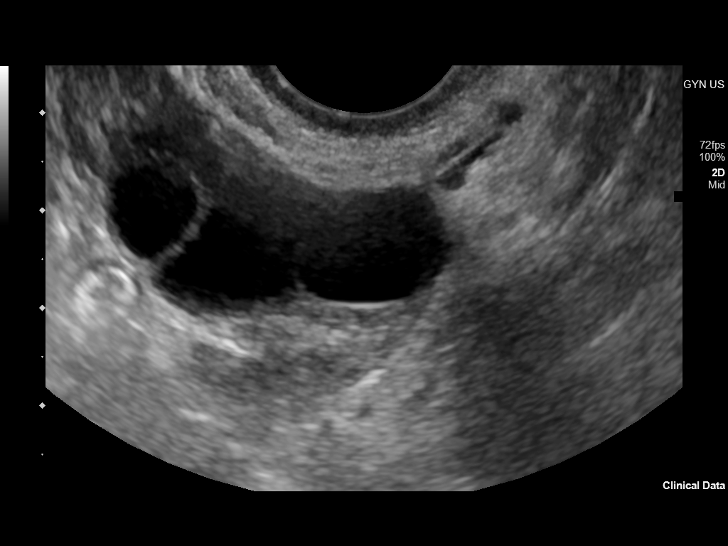
[im 59/79]
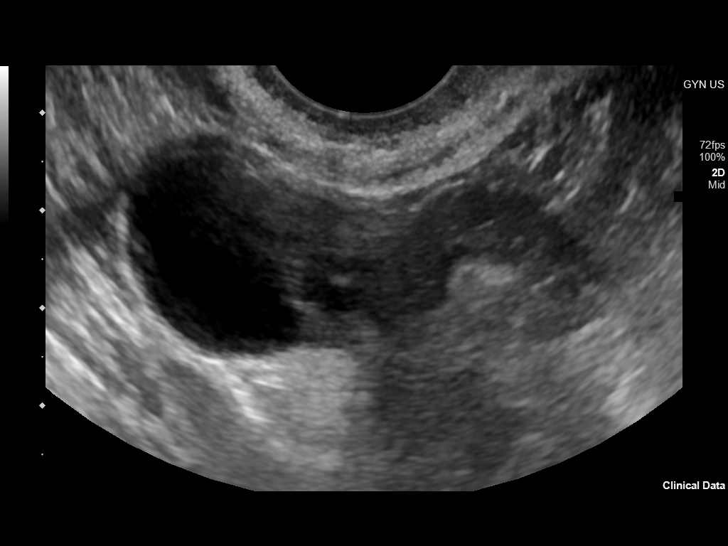
[im 66/79]
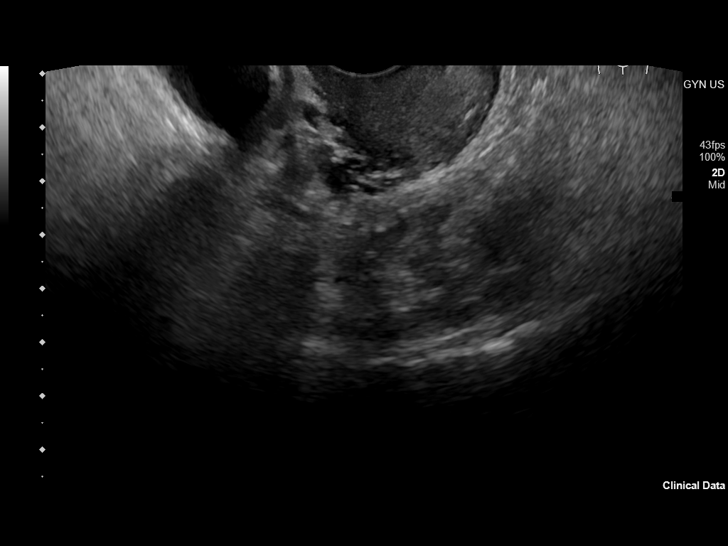
[im 72/79]
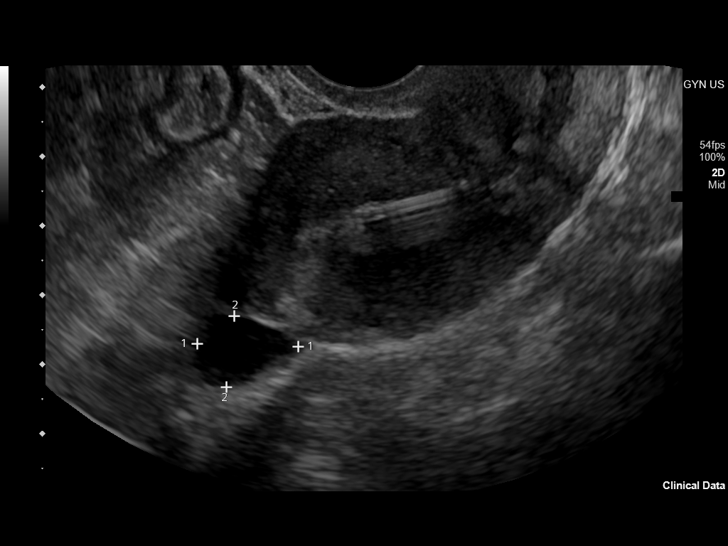
[im 79/79]
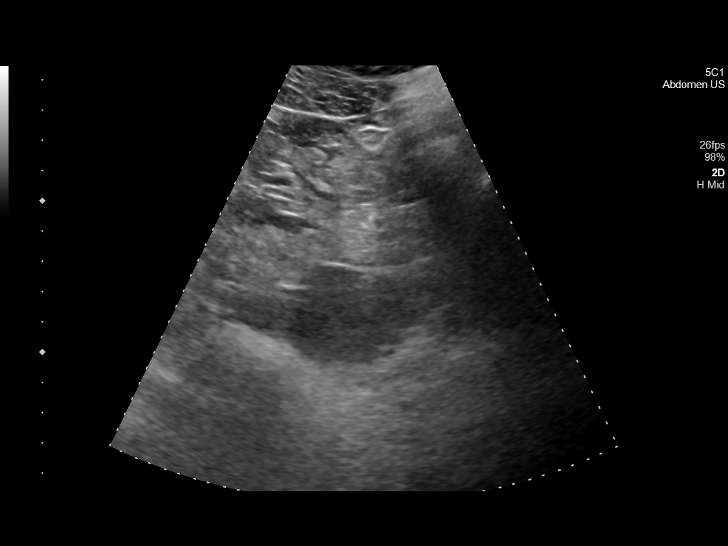

[13 of 25 positions shown; findings below may reference images not displayed]

FINDINGS: Uterus

Measurements: 7.7 x 2.9 x 4.1 cm = volume: 49 mL. Anteverted. Normal
morphology without mass

Endometrium

Thickness: 6 mm. IUD in expected position at upper uterine segment
endometrial canal. No endometrial fluid or mass.

Right ovary

Measurements: 3.6 x 1.9 x 3.0 cm = volume: 10.6 mL. Multiple
dominant follicles largest 2.2 cm; no follow-up imaging recommended.
No additional masses.

Left ovary

Measurements: 1.6 x 1.3 x 1.7 cm = volume: 2.0 cm mL. Normal
morphology without mass

Other findings

No free pelvic fluid.  No adnexal masses.
IMPRESSION: IUD in expected position at the upper uterine segment endometrial
canal.

Otherwise normal exam.

## 2023-02-21 ENCOUNTER — Telehealth: Payer: 59 | Admitting: Internal Medicine

## 2023-03-21 ENCOUNTER — Other Ambulatory Visit: Payer: Self-pay

## 2023-03-21 ENCOUNTER — Ambulatory Visit (INDEPENDENT_AMBULATORY_CARE_PROVIDER_SITE_OTHER): Payer: 59 | Admitting: Pharmacist

## 2023-03-21 ENCOUNTER — Ambulatory Visit: Payer: 59 | Admitting: Pharmacist

## 2023-03-21 DIAGNOSIS — Z2981 Encounter for HIV pre-exposure prophylaxis: Secondary | ICD-10-CM

## 2023-03-21 DIAGNOSIS — Z79899 Other long term (current) drug therapy: Secondary | ICD-10-CM

## 2023-03-21 MED ORDER — CABOTEGRAVIR ER 600 MG/3ML IM SUER
600.0000 mg | Freq: Once | INTRAMUSCULAR | Status: AC
  Administered 2023-03-21: 600 mg via INTRAMUSCULAR

## 2023-03-21 NOTE — Progress Notes (Signed)
HPI: Michele Hodge is a 37 y.o. female who presents to the [facility] pharmacy clinic for Apretude administration and HIV PrEP follow up.  Insured   [x]    Uninsured  []    Patient Active Problem List   Diagnosis Date Noted   Prediabetes 05/05/2022   Mixed hyperlipidemia 09/17/2021   Class 1 obesity due to excess calories with body mass index (BMI) of 32.0 to 32.9 in adult 09/16/2021   ADHD 09/16/2021   Gastroesophageal reflux disease 04/17/2017    Patient's Medications  New Prescriptions   No medications on file  Previous Medications   AMPHETAMINE-DEXTROAMPHETAMINE (ADDERALL) 10 MG TABLET    Take 10 mg by mouth every morning.   ATOMOXETINE (STRATTERA) 60 MG CAPSULE    Take 60 mg by mouth daily.   ATORVASTATIN (LIPITOR) 10 MG TABLET    Take 1 tablet by mouth once daily   ATORVASTATIN (LIPITOR) 10 MG TABLET    Take 1 tablet (10 mg total) by mouth daily. Please schedule a physical before anymore refills.   LEVONORGESTREL (MIRENA) 20 MCG/DAY IUD    by Intrauterine route.   METRONIDAZOLE (FLAGYL) 500 MG TABLET    Take 1 tablet (500 mg total) by mouth 2 (two) times daily. Do not drink alcohol while taking this medicine.   NITROFURANTOIN, MACROCRYSTAL-MONOHYDRATE, (MACROBID) 100 MG CAPSULE    Take 1 capsule (100 mg total) by mouth 2 (two) times daily.  Modified Medications   No medications on file  Discontinued Medications   No medications on file    Allergies: No Known Allergies  Labs: Lab Results  Component Value Date   HIV1RNAQUANT Not Detected 01/26/2023   HIV1RNAQUANT Not Detected 11/18/2022   HIV1RNAQUANT Not Detected 09/28/2022    RPR and STI Lab Results  Component Value Date   LABRPR NON-REACTIVE 01/26/2023   LABRPR NON-REACTIVE 09/28/2022   LABRPR NON-REACTIVE 08/05/2022   LABRPR NON-REACTIVE 02/17/2021   LABRPR NON-REACTIVE 12/23/2020    STI Results GC GC CT CT  11/28/2022  3:35 PM Negative   Negative    09/28/2022  2:21 PM Negative    Negative   Negative     Negative    08/02/2022  3:13 PM Negative    Negative   Negative    Negative    06/29/2022 10:31 AM Negative   Negative    11/24/2021  2:24 PM Negative   Negative    09/16/2021  3:06 PM Negative   Negative    02/17/2021  2:41 PM Negative   Negative    12/22/2020  3:20 PM Negative   Negative    02/05/2019 12:00 AM Negative   Negative    04/17/2017  5:00 PM   Negative    12/26/2016 11:52 AM  NOT DETECTED   NOT DETECTED     Hepatitis B Lab Results  Component Value Date   HEPBSAB REACTIVE (A) 02/17/2021   HEPBSAG NON-REACTIVE 02/17/2021   Hepatitis C Lab Results  Component Value Date   HEPCAB NON-REACTIVE 02/17/2021   Hepatitis A Lab Results  Component Value Date   HAV REACTIVE (A) 02/17/2021   Lipids: Lab Results  Component Value Date   CHOL 200 (H) 05/05/2022   TRIG 229 (H) 05/05/2022   HDL 50 05/05/2022   CHOLHDL 4.0 05/05/2022   VLDL 33 (H) 12/26/2016   LDLCALC 115 (H) 05/05/2022    TARGET DATE: 03/28/2023  Assessment: Michele Hodge presents today for maintenance Apretude injection and to follow up for HIV PrEP. No issues with past injections.  Denies any symptoms of acute HIV. Last STI screening was on 11/28/22 and was positive for bacterial vaginitis, negative for GC/CT. Last RPR was non-reactive on 01/26/23. No known exposures to any STIs since last visit. Michele Hodge declines STI, and reports no new partners or recent exposures.   Michele Hodge expressed concerns of intermittent breast pain and dysmenorrhea. She was seen by her PCP for this, and a mammogram was performed. I am unable to see the encounter related to this. Reviewed with her adverse event profile of Apretude. At this time, Apretude has not been connected to endocrine effects related to menstruation/menstrual pain or breast pain. Michele Hodge reports having a Mirena IUD placed 5 years ago, and has had some movement of IUD that has caused issues with dysmenorrhea in the past. Recommend follow up with PCP regarding benefits/risk  discussion of continuing with current IUD. Michele Hodge endorsed understanding and agreement to this.  Per Michele Hodge guidelines, a rapid HIV test should be drawn prior to Apretude administration. Due to state shortage of rapid HIV tests, this is temporarily unable to be done. Per decision from [facility] physicians, we will proceed with Apretude administration at this time without a negative rapid HIV test beforehand. HIV RNA was collected today and is in process.  Administered cabotegravir 600mg /30mL in left upper outer quadrant of the gluteal muscle. Will see Michele Hodge back in 2 months for injection, labs, and HIV PrEP follow up.  Plan: - Apretude injection administered - HIV RNA today - Next injection, labs, and PrEP follow up appointment scheduled for 05/22/23 - Call with any issues or questions  Michele Hodge, PharmD PGY-2 Infectious Diseases Pharmacy Resident 03/21/2023 3:01 PM

## 2023-03-22 ENCOUNTER — Ambulatory Visit: Payer: 59 | Admitting: Pharmacist

## 2023-03-22 LAB — HIV-1 RNA QUANT-NO REFLEX-BLD
HIV 1 RNA Quant: NOT DETECTED {copies}/mL
HIV-1 RNA Quant, Log: NOT DETECTED {Log_copies}/mL

## 2023-04-06 ENCOUNTER — Other Ambulatory Visit: Payer: Self-pay | Admitting: Internal Medicine

## 2023-04-06 NOTE — Telephone Encounter (Signed)
Requested Prescriptions  Pending Prescriptions Disp Refills   atorvastatin (LIPITOR) 10 MG tablet [Pharmacy Med Name: Atorvastatin Calcium 10 MG Oral Tablet] 90 tablet 0    Sig: Take 1 tablet by mouth once daily     Cardiovascular:  Antilipid - Statins Failed - 04/06/2023  2:29 PM      Failed - Lipid Panel in normal range within the last 12 months    Cholesterol  Date Value Ref Range Status  05/05/2022 200 (H) <200 mg/dL Final   LDL Cholesterol (Calc)  Date Value Ref Range Status  05/05/2022 115 (H) mg/dL (calc) Final    Comment:    Reference range: <100 . Desirable range <100 mg/dL for primary prevention;   <70 mg/dL for patients with CHD or diabetic patients  with > or = 2 CHD risk factors. Marland Kitchen LDL-C is now calculated using the Martin-Hopkins  calculation, which is a validated novel method providing  better accuracy than the Friedewald equation in the  estimation of LDL-C.  Horald Pollen et al. Lenox Ahr. 1610;960(45): 2061-2068  (http://education.[facility].com/faq/FAQ164)    HDL  Date Value Ref Range Status  05/05/2022 50 > OR = 50 mg/dL Final   Triglycerides  Date Value Ref Range Status  05/05/2022 229 (H) <150 mg/dL Final    Comment:    . If a non-fasting specimen was collected, consider repeat triglyceride testing on a fasting specimen if clinically indicated.  Perry Mount et al. J. of Clin. Lipidol. 2015;9:129-169. Marland Kitchen          Passed - Patient is not pregnant      Passed - Valid encounter within last 12 months    Recent Outpatient Visits           4 months ago Urinary urgency   [place] [facility] [place], Salvadore Oxford, NP   9 months ago Vaginal itching   [facility] [place], Salvadore Oxford, NP   11 months ago Hives   [place] [facility] [place], Salvadore Oxford, NP   1 year ago Encounter for general adult medical examination with abnormal findings   [place] [facility] [place], Salvadore Oxford, NP   2  years ago IUD migration, initial encounter   [facility] [place], Salvadore Oxford, NP       Future Appointments             In 3 months Deirdre Evener, MD [facility]             Pt advised needs OV 30 day Courtesy rf

## 2023-04-06 NOTE — Telephone Encounter (Signed)
This encounter was created in error - please disregard.

## 2023-04-17 ENCOUNTER — Ambulatory Visit: Payer: 59 | Admitting: Internal Medicine

## 2023-04-27 ENCOUNTER — Ambulatory Visit (INDEPENDENT_AMBULATORY_CARE_PROVIDER_SITE_OTHER): Payer: 59 | Admitting: Internal Medicine

## 2023-04-27 ENCOUNTER — Encounter: Payer: Self-pay | Admitting: Internal Medicine

## 2023-04-27 VITALS — BP 126/84 | HR 78 | Temp 96.4°F | Ht 71.0 in | Wt 238.0 lb

## 2023-04-27 DIAGNOSIS — Z0001 Encounter for general adult medical examination with abnormal findings: Secondary | ICD-10-CM | POA: Diagnosis not present

## 2023-04-27 DIAGNOSIS — R7303 Prediabetes: Secondary | ICD-10-CM | POA: Diagnosis not present

## 2023-04-27 DIAGNOSIS — E782 Mixed hyperlipidemia: Secondary | ICD-10-CM

## 2023-04-27 DIAGNOSIS — Z6833 Body mass index (BMI) 33.0-33.9, adult: Secondary | ICD-10-CM

## 2023-04-27 DIAGNOSIS — Z30431 Encounter for routine checking of intrauterine contraceptive device: Secondary | ICD-10-CM

## 2023-04-27 DIAGNOSIS — E6609 Other obesity due to excess calories: Secondary | ICD-10-CM

## 2023-04-27 DIAGNOSIS — E010 Iodine-deficiency related diffuse (endemic) goiter: Secondary | ICD-10-CM

## 2023-04-27 DIAGNOSIS — E66811 Obesity, class 1: Secondary | ICD-10-CM

## 2023-04-27 NOTE — Assessment & Plan Note (Signed)
Encouraged diet and exercise for weight loss ?

## 2023-04-27 NOTE — Progress Notes (Signed)
Subjective:    Patient ID: Michele Hodge, female    DOB: [birth year], 37 y.o.   MRN: [id number]  HPI  Patient presents to clinic today for her annual exam.  Flu: 07/2021 Tetanus: 01/2019 COVID: x 3 Pap smear: 01/2019, no HPV testing Dentist: bianually  Diet: She does eat meat.  She consumes fruits and vegetables.  She does eat some fried foods. Exercise: None  Review of Systems     Past Medical History:  Diagnosis Date   Healthy adult on routine physical examination     Current Outpatient Medications  Medication Sig Dispense Refill   amphetamine-dextroamphetamine (ADDERALL) 10 MG tablet Take 10 mg by mouth every morning.     atomoxetine (STRATTERA) 60 MG capsule Take 60 mg by mouth daily.     atorvastatin (LIPITOR) 10 MG tablet Take 1 tablet (10 mg total) by mouth daily. Please schedule a physical before anymore refills. 30 tablet 0   atorvastatin (LIPITOR) 10 MG tablet Take 1 tablet by mouth once daily 30 tablet 0   levonorgestrel (MIRENA) 20 MCG/DAY IUD by Intrauterine route.     metroNIDAZOLE (FLAGYL) 500 MG tablet Take 1 tablet (500 mg total) by mouth 2 (two) times daily. Do not drink alcohol while taking this medicine. 14 tablet 0   nitrofurantoin, macrocrystal-monohydrate, (MACROBID) 100 MG capsule Take 1 capsule (100 mg total) by mouth 2 (two) times daily. 10 capsule 0   No current facility-administered medications for this visit.    No Known Allergies  Family History  Problem Relation Age of Onset   Diabetes Father 77   Stroke Maternal Grandmother    CAD Maternal Grandfather    Stroke Paternal Grandmother    Breast cancer Cousin    Schizophrenia Brother 20   Colon cancer Neg Hx    Ovarian cancer Neg Hx     Social History   Socioeconomic History   Marital status: Single    Spouse name: Not on file   Number of children: Not on file   Years of education: Not on file   Highest education level: Bachelor's degree (e.g., BA, AB, BS)  Occupational History    Not on file  Tobacco Use   Smoking status: Never   Smokeless tobacco: Never  Vaping Use   Vaping status: Never Used  Substance and Sexual Activity   Alcohol use: Yes    Alcohol/week: 1.0 standard drink of alcohol    Types: 1 Glasses of wine per week    Comment: occasional use   Drug use: Not Currently    Types: Marijuana   Sexual activity: Never  Other Topics Concern   Not on file  Social History Narrative   Not on file   Social Determinants of Health   Financial Resource Strain: High Risk (04/24/2023)   Overall Financial Resource Strain (CARDIA)    Difficulty of Paying Living Expenses: Hard  Food Insecurity: Food Insecurity Present (04/24/2023)   Hunger Vital Sign    Worried About Running Out of Food in the Last Year: Often true    Ran Out of Food in the Last Year: Sometimes true  Transportation Needs: No Transportation Needs (04/24/2023)   PRAPARE - Administrator, Civil Service (Medical): No    Lack of Transportation (Non-Medical): No  Physical Activity: Insufficiently Active (04/24/2023)   Exercise Vital Sign    Days of Exercise per Week: 4 days    Minutes of Exercise per Session: 20 min  Stress: Stress Concern Present (04/24/2023)  [employer] of Occupational Health - Occupational Stress Questionnaire    Feeling of Stress : Rather much  Social Connections: Socially Isolated (04/24/2023)   Social Connection and Isolation Panel [NHANES]    Frequency of Communication with Friends and Family: More than three times a week    Frequency of Social Gatherings with Friends and Family: Twice a week    Attends Religious Services: Never    Database administrator or Organizations: No    Attends Engineer, structural: Not on file    Marital Status: Never married  Intimate Partner Violence: Not on file     Constitutional: Denies fever, malaise, fatigue, headache or abrupt weight changes.  HEENT: Denies eye pain, eye redness, ear pain, ringing in the  ears, wax buildup, runny nose, nasal congestion, bloody nose, or sore throat. Respiratory: Denies difficulty breathing, shortness of breath, cough or sputum production.   Cardiovascular: Denies chest pain, chest tightness, palpitations or swelling in the hands or feet.  Gastrointestinal: Pt reports intermittent diarrhea. Denies abdominal pain, bloating, constipation, or blood in the stool.  GU: Denies urgency, frequency, pain with urination, burning sensation, blood in urine, odor or discharge. Musculoskeletal: Denies decrease in range of motion, difficulty with gait, muscle pain or joint pain and swelling.  Skin: Denies redness, rashes, lesions or ulcercations.  Neurological: Patient reports inattention.  Denies dizziness, difficulty with memory, difficulty with speech or problems with balance and coordination.  Psych: Denies anxiety, depression, SI/HI.  No other specific complaints in a complete review of systems (except as listed in HPI above).  Objective:   Physical Exam BP 126/84 (BP Location: Left Arm, Patient Position: Sitting, Cuff Size: Large)   Pulse 78   Temp (!) 96.4 F (35.8 C) (Temporal)   Ht 5\' 11"  (1.803 m)   Wt 238 lb (108 kg)   SpO2 98%   BMI 33.19 kg/m   Wt Readings from Last 3 Encounters:  06/29/22 233 lb (105.7 kg)  05/05/22 234 lb (106.1 kg)  09/16/21 220 lb (99.8 kg)    General: Appears her stated age, obese, in NAD. Skin: Warm, dry and intact. HEENT: Head: normal shape and size; Eyes: sclera white, no icterus, conjunctiva pink, PERRLA and EOMs intact;   Neck:  Neck supple, trachea midline. No masses, lumps present.  Thyromegaly noted. Cardiovascular: Normal rate and rhythm. S1,S2 noted.  No murmur, rubs or gallops noted. No JVD or BLE edema.  Pulmonary/Chest: Normal effort and positive vesicular breath sounds. No respiratory distress. No wheezes, rales or ronchi noted.  Abdomen: Soft and nontender. Normal bowel sounds.  Musculoskeletal: Strength 5/5  BUE/BLE.  No difficulty with gait.  Neurological: Alert and oriented. Cranial nerves II-XII grossly intact. Coordination normal.  Psychiatric: Mood and affect normal.  Anxious appearing. Judgment and thought content normal.     BMET    Component Value Date/Time   NA 135 05/05/2022 1004   K 4.2 05/05/2022 1004   CL 102 05/05/2022 1004   CO2 26 05/05/2022 1004   GLUCOSE 96 05/05/2022 1004   BUN 11 05/05/2022 1004   CREATININE 0.74 05/05/2022 1004   CALCIUM 9.1 05/05/2022 1004   GFRNONAA 91 02/06/2019 0853   GFRAA 106 02/06/2019 0853    Lipid Panel     Component Value Date/Time   CHOL 200 (H) 05/05/2022 1004   TRIG 229 (H) 05/05/2022 1004   HDL 50 05/05/2022 1004   CHOLHDL 4.0 05/05/2022 1004   VLDL 33 (H) 12/26/2016 0909   LDLCALC 115 (  H) 05/05/2022 1004    CBC    Component Value Date/Time   WBC 6.6 09/16/2021 1451   RBC 4.84 09/16/2021 1451   HGB 14.7 09/16/2021 1451   HCT 43.2 09/16/2021 1451   PLT 321 09/16/2021 1451   MCV 89.3 09/16/2021 1451   MCH 30.4 09/16/2021 1451   MCHC 34.0 09/16/2021 1451   RDW 12.2 09/16/2021 1451   LYMPHSABS 3,434 02/06/2019 0853   MONOABS 0.5 12/29/2017 0308   EOSABS 111 02/06/2019 0853   BASOSABS 68 02/06/2019 0853    Hgb A1C Lab Results  Component Value Date   HGBA1C 5.7 (H) 05/05/2022           Assessment & Plan:   Preventative health maintenance:  Shot declined Tetanus UTD Encouraged her to get her COVID-vaccine Pap smear declined today, referral to GYN for Pap smear and IUD check Encouraged her to consume a balanced diet and exercise regimen Advised her to see an eye doctor and dentist annually We will check CBC, TSH, c-Met, lipid, A1c today  Thyromegaly:  TSH today  RTC in 6 months, follow-up chronic conditions [name], NP

## 2023-04-27 NOTE — Patient Instructions (Signed)

## 2023-04-28 LAB — COMPLETE METABOLIC PANEL WITH GFR
AG Ratio: 1.9 (calc) (ref 1.0–2.5)
ALT: 47 U/L — ABNORMAL HIGH (ref 6–29)
AST: 34 U/L — ABNORMAL HIGH (ref 10–30)
Albumin: 4.7 g/dL (ref 3.6–5.1)
Alkaline phosphatase (APISO): 51 U/L (ref 31–125)
BUN: 13 mg/dL (ref 7–25)
CO2: 23 mmol/L (ref 20–32)
Calcium: 9.7 mg/dL (ref 8.6–10.2)
Chloride: 106 mmol/L (ref 98–110)
Creat: 0.79 mg/dL (ref 0.50–0.97)
Globulin: 2.5 g/dL (ref 1.9–3.7)
Glucose, Bld: 89 mg/dL (ref 65–99)
Potassium: 3.9 mmol/L (ref 3.5–5.3)
Sodium: 139 mmol/L (ref 135–146)
Total Bilirubin: 0.6 mg/dL (ref 0.2–1.2)
Total Protein: 7.2 g/dL (ref 6.1–8.1)
eGFR: 99 mL/min/{1.73_m2} (ref >=60)

## 2023-04-28 LAB — HEMOGLOBIN A1C
Hgb A1c MFr Bld: 6 %{Hb} — ABNORMAL HIGH (ref <5.7)
Mean Plasma Glucose: 126 mg/dL
eAG (mmol/L): 7 mmol/L

## 2023-04-28 LAB — CBC
HCT: 42.1 % (ref 35.0–45.0)
Hemoglobin: 14.1 g/dL (ref 11.7–15.5)
MCH: 30.3 pg (ref 27.0–33.0)
MCHC: 33.5 g/dL (ref 32.0–36.0)
MCV: 90.5 fL (ref 80.0–100.0)
MPV: 9.7 fL (ref 7.5–12.5)
Platelets: 318 10*3/uL (ref 140–400)
RBC: 4.65 10*6/uL (ref 3.80–5.10)
RDW: 11.9 % (ref 11.0–15.0)
WBC: 8.9 10*3/uL (ref 3.8–10.8)

## 2023-04-28 LAB — LIPID PANEL
Cholesterol: 144 mg/dL (ref <200)
HDL: 44 mg/dL — ABNORMAL LOW (ref >=50)
LDL Cholesterol (Calc): 70 mg/dL
Non-HDL Cholesterol (Calc): 100 mg/dL (ref <130)
Total CHOL/HDL Ratio: 3.3 (calc) (ref <5.0)
Triglycerides: 209 mg/dL — ABNORMAL HIGH (ref <150)

## 2023-04-28 LAB — TSH: TSH: 1.66 m[IU]/L

## 2023-05-07 ENCOUNTER — Encounter: Payer: Self-pay | Admitting: Internal Medicine

## 2023-05-08 MED ORDER — ATORVASTATIN CALCIUM 20 MG PO TABS: 20.0000 mg | ORAL_TABLET | Freq: Every day | ORAL | 1 refills | Status: AC

## 2023-05-22 ENCOUNTER — Ambulatory Visit: Payer: 59 | Admitting: Pharmacist

## 2023-05-24 NOTE — Progress Notes (Signed)
HPI: Michele Hodge is a 37 y.o. female who presents to the [facility] pharmacy clinic for Apretude administration and HIV PrEP follow up.  Insured   [x]    Uninsured  []    Patient Active Problem List   Diagnosis Date Noted   Prediabetes 05/05/2022   Mixed hyperlipidemia 09/17/2021   Class 1 obesity due to excess calories with body mass index (BMI) of 33.0 to 33.9 in adult 09/16/2021   ADHD 09/16/2021   Gastroesophageal reflux disease 04/17/2017    Patient's Medications  New Prescriptions   No medications on file  Previous Medications   AMPHETAMINE-DEXTROAMPHETAMINE (ADDERALL) 10 MG TABLET    Take 10 mg by mouth every morning.   ATOMOXETINE (STRATTERA) 60 MG CAPSULE    Take 60 mg by mouth daily.   ATORVASTATIN (LIPITOR) 20 MG TABLET    Take 1 tablet (20 mg total) by mouth daily.   LEVONORGESTREL (MIRENA) 20 MCG/DAY IUD    by Intrauterine route.  Modified Medications   No medications on file  Discontinued Medications   No medications on file    Allergies: No Known Allergies  Labs: Lab Results  Component Value Date   HIV1RNAQUANT Not Detected 03/21/2023   HIV1RNAQUANT Not Detected 01/26/2023   HIV1RNAQUANT Not Detected 11/18/2022    RPR and STI Lab Results  Component Value Date   LABRPR NON-REACTIVE 01/26/2023   LABRPR NON-REACTIVE 09/28/2022   LABRPR NON-REACTIVE 08/05/2022   LABRPR NON-REACTIVE 02/17/2021   LABRPR NON-REACTIVE 12/23/2020    STI Results GC GC CT CT  11/28/2022  3:35 PM Negative   Negative    09/28/2022  2:21 PM Negative    Negative   Negative    Negative    08/02/2022  3:13 PM Negative    Negative   Negative    Negative    06/29/2022 10:31 AM Negative   Negative    11/24/2021  2:24 PM Negative   Negative    09/16/2021  3:06 PM Negative   Negative    02/17/2021  2:41 PM Negative   Negative    12/22/2020  3:20 PM Negative   Negative    02/05/2019 12:00 AM Negative   Negative    04/17/2017  5:00 PM   Negative    12/26/2016 11:52 AM  NOT DETECTED    NOT DETECTED     Hepatitis B Lab Results  Component Value Date   HEPBSAB REACTIVE (A) 02/17/2021   HEPBSAG NON-REACTIVE 02/17/2021   Hepatitis C Lab Results  Component Value Date   HEPCAB NON-REACTIVE 02/17/2021   Hepatitis A Lab Results  Component Value Date   HAV REACTIVE (A) 02/17/2021   Lipids: Lab Results  Component Value Date   CHOL 144 04/27/2023   TRIG 209 (H) 04/27/2023   HDL 44 (L) 04/27/2023   CHOLHDL 3.3 04/27/2023   VLDL 33 (H) 12/26/2016   LDLCALC 70 04/27/2023    TARGET DATE: The 10th  Assessment: Michele Hodge presents today for her Apretude injection and to follow up for HIV PrEP. No issues with past injections. Denies any symptoms of acute HIV. No known exposures to any STIs since last visit. She has had several new partners; always uses condoms. Agrees to STI testing today. No rectal or oral exposures.  Per [employer] guidelines, a rapid HIV test should be drawn prior to Apretude administration. Due to state shortage of rapid HIV tests, this is temporarily unable to be done. Per decision from [facility] physicians, we will proceed with Apretude administration at  this time without a negative rapid HIV test beforehand. HIV RNA was collected today and is in process.  Administered cabotegravir 600mg /38mL in left upper outer quadrant of the gluteal muscle. Will see her back in 2 months for injection, labs, and HIV PrEP follow up.   Of note, she is not interested in any further HPV vaccines.  Plan: - Apretude injection administered - HIV RNA, RPR, and urine cytology today - Next injection, labs, and PrEP follow up appointment scheduled for 07/26/23 with [facility] - Call with any issues or questions  Chlora Mcbain L. Rasheen Bells, PharmD, BCIDP, AAHIVP, CPP Clinical Pharmacist Practitioner Infectious Diseases Clinical Pharmacist [facility]

## 2023-05-25 ENCOUNTER — Other Ambulatory Visit: Payer: Self-pay

## 2023-05-25 ENCOUNTER — Ambulatory Visit (INDEPENDENT_AMBULATORY_CARE_PROVIDER_SITE_OTHER): Payer: 59 | Admitting: Pharmacist

## 2023-05-25 ENCOUNTER — Other Ambulatory Visit (HOSPITAL_COMMUNITY)
Admission: RE | Admit: 2023-05-25 | Discharge: 2023-05-25 | Disposition: A | Discharge status: Home / Self Care | Payer: 59 | Source: Ambulatory Visit | Attending: Infectious Disease | Admitting: Infectious Disease

## 2023-05-25 DIAGNOSIS — Z79899 Other long term (current) drug therapy: Secondary | ICD-10-CM

## 2023-05-25 DIAGNOSIS — Z113 Encounter for screening for infections with a predominantly sexual mode of transmission: Secondary | ICD-10-CM | POA: Diagnosis present

## 2023-05-25 DIAGNOSIS — Z2981 Encounter for HIV pre-exposure prophylaxis: Secondary | ICD-10-CM

## 2023-05-25 MED ORDER — CABOTEGRAVIR ER 600 MG/3ML IM SUER
600.0000 mg | Freq: Once | INTRAMUSCULAR | Status: AC
  Administered 2023-05-25: 600 mg via INTRAMUSCULAR

## 2023-05-26 LAB — URINE CYTOLOGY ANCILLARY ONLY
Chlamydia: NEGATIVE
Comment: NEGATIVE
Comment: NORMAL
Neisseria Gonorrhea: NEGATIVE

## 2023-05-28 LAB — RPR: RPR Ser Ql: NONREACTIVE

## 2023-05-28 LAB — HIV-1 RNA QUANT-NO REFLEX-BLD
HIV 1 RNA Quant: NOT DETECTED {copies}/mL
HIV-1 RNA Quant, Log: NOT DETECTED {Log_copies}/mL

## 2023-06-12 ENCOUNTER — Encounter: Payer: Self-pay | Admitting: Internal Medicine

## 2023-06-12 DIAGNOSIS — R0683 Snoring: Secondary | ICD-10-CM

## 2023-07-06 ENCOUNTER — Ambulatory Visit: Payer: 59 | Admitting: Dermatology

## 2023-07-06 DIAGNOSIS — Z79899 Other long term (current) drug therapy: Secondary | ICD-10-CM

## 2023-07-06 DIAGNOSIS — Z7189 Other specified counseling: Secondary | ICD-10-CM

## 2023-07-06 DIAGNOSIS — L739 Follicular disorder, unspecified: Secondary | ICD-10-CM | POA: Diagnosis not present

## 2023-07-06 MED ORDER — KETOCONAZOLE 2 % EX SHAM: 1.0000 | MEDICATED_SHAMPOO | CUTANEOUS | 5 refills | Status: DC

## 2023-07-06 MED ORDER — DOXYCYCLINE MONOHYDRATE 100 MG PO CAPS: 100.0000 mg | ORAL_CAPSULE | Freq: Every day | ORAL | 1 refills | Status: DC

## 2023-07-06 NOTE — Progress Notes (Signed)
° °  New Patient Visit   Subjective  Michele Hodge is a 37 y.o. female who presents for the following: itching scalp, neck, back, hands, has been on and off past month, take allergy medication prn The patient has spots, moles and lesions to be evaluated, some may be new or changing and the patient may have concern these could be cancer.  New patient referral from Michele Reaper, NP.  The following portions of the chart were reviewed this encounter and updated as appropriate: medications, allergies, medical history  Review of Systems:  No other skin or systemic complaints except as noted in HPI or Assessment and Plan.  Objective  Well appearing patient in no apparent distress; mood and affect are within normal limits.   A focused examination was performed of the following areas: Scalp, neck, back, hands  Relevant exam findings are noted in the Assessment and Plan.             Assessment & Plan   Spotty hyperpigmentation back and buttocks  FOLLICULITIS Buttocks, back, chest Sterile vs bacterial vs yeast folliculitis -consider biopsy in the future if not improving. Exam: spotty hyperpigmentation back, buttocks, chest, active small paps and comedones back  Chronic and persistent condition with duration or expected duration over one year. Condition is symptomatic / bothersome to patient. Not to goal.  Folliculitis occurs due to inflammation of the superficial hair follicle (pore), resulting in acne-like lesions (pus bumps). It can be infectious (bacterial, fungal) or noninfectious (shaving, tight clothing, heat/sweat, medications).  Folliculitis can be acute or chronic and recommended treatment depends on the underlying cause of folliculitis.  Treatment Plan: Start Ketoconazole 2% shampoo as body wash to scalp, face, and body 3d/wk, let sit 5 minutes and rinse off, avoid eyes Start Doxycycline 100mg  1 po qd with food and drink  Doxycycline should be taken with food to  prevent nausea. Do not lay down for 30 minutes after taking. Be cautious with sun exposure and use good sun protection while on this medication. Pregnant women should not take this medication.    Return for 3-6 months f/u folliculutis .  I, [name], RMA, am acting as scribe for [name], MD .  Documentation: I have reviewed the above documentation for accuracy and completeness, and I agree with the above.  [name], MD

## 2023-07-06 NOTE — Patient Instructions (Addendum)
Start Ketoconazole 2% shampoo as bodywash to scalp, face, and body 3 days a week, let sit 5 minutes and rinse off, avoid eyes Start Doxycycline 100mg  1 pill a day with food and drink  Doxycycline should be taken with food to prevent nausea. Do not lay down for 30 minutes after taking. Be cautious with sun exposure and use good sun protection while on this medication. Pregnant women should not take this medication.    Due to recent changes in healthcare laws, you may see results of your pathology and/or laboratory studies on MyChart before the doctors have had a chance to review them. We understand that in some cases there may be results that are confusing or concerning to you. Please understand that not all results are received at the same time and often the doctors may need to interpret multiple results in order to provide you with the best plan of care or course of treatment. Therefore, we ask that you please give [place] 2 business days to thoroughly review all your results before contacting the office for clarification. Should we see a critical lab result, you will be contacted sooner.   If You Need Anything After Your Visit  If you have any questions or concerns for your doctor, please call our main line at [phone] and press option 4 to reach your doctor's medical assistant. If no one answers, please leave a voicemail as directed and we will return your call as soon as possible. Messages left after 4 pm will be answered the following business day.   You may also send [place] a message via MyChart. We typically respond to MyChart messages within 1-2 business days.  For prescription refills, please ask your pharmacy to contact our office. Our fax number is [phone].  If you have an urgent issue when the clinic is closed that cannot wait until the next business day, you can page your doctor at the number below.    Please note that while we do our best to be available for urgent issues outside of  office hours, we are not available 24/7.   If you have an urgent issue and are unable to reach [place], you may choose to seek medical care at your doctor's office, retail clinic, urgent care center, or emergency room.  If you have a medical emergency, please immediately call 911 or go to the emergency department.  Pager Numbers  - Dr. Gwen Pounds: [phone]  - Dr. Roseanne Reno: [phone]  - Dr. Katrinka Blazing: [phone]   In the event of inclement weather, please call our main line at [phone] for an update on the status of any delays or closures.  Dermatology Medication Tips: Please keep the boxes that topical medications come in in order to help keep track of the instructions about where and how to use these. Pharmacies typically print the medication instructions only on the boxes and not directly on the medication tubes.   If your medication is too expensive, please contact our office at [phone] option 4 or send [place] a message through MyChart.   We are unable to tell what your co-pay for medications will be in advance as this is different depending on your insurance coverage. However, we may be able to find a substitute medication at lower cost or fill out paperwork to get insurance to cover a needed medication.   If a prior authorization is required to get your medication covered by your insurance company, please allow [place] 1-2 business days to complete this process.  Drug prices often vary depending on where the prescription is filled and some pharmacies may offer cheaper prices.  The website www.goodrx.com contains coupons for medications through different pharmacies. The prices here do not account for what the cost may be with help from insurance (it may be cheaper with your insurance), but the website can give you the price if you did not use any insurance.  - You can print the associated coupon and take it with your prescription to the pharmacy.  - You may also stop by our office  during regular business hours and pick up a GoodRx coupon card.  - If you need your prescription sent electronically to a different pharmacy, notify our office through [facility] or by phone at [phone] option 4.     Si Usted Necesita Algo Despus de Su Visita  Tambin puede enviarnos un mensaje a travs de Clinical cytogeneticist. Por lo general respondemos a los mensajes de MyChart en el transcurso de 1 a 2 das hbiles.  Para renovar recetas, por favor pida a su farmacia que se ponga en contacto con nuestra oficina. Annie Sable de fax es [place] [phone].  Si tiene un asunto urgente cuando la clnica est cerrada y que no puede esperar hasta el siguiente da hbil, puede llamar/localizar a su doctor(a) al nmero que aparece a continuacin.   Por favor, tenga en cuenta que aunque hacemos todo lo posible para estar disponibles para asuntos urgentes fuera del horario de [place], no estamos disponibles las 24 horas del da, los 7 [address]  [address] de la [place].   Si tiene un problema urgente y no puede comunicarse con nosotros, puede optar por buscar atencin mdica  en el consultorio de su doctor(a), en una clnica privada, en un centro de atencin urgente o en una sala de emergencias.  Si tiene Engineer, drilling, por favor llame inmediatamente al 911 o vaya a la sala de emergencias.  Nmeros de bper  - Dr. Gwen Pounds: [phone]  - Dra. Roseanne Reno: [phone]  - Dr. Katrinka Blazing: [phone]   En caso de inclemencias del tiempo, por favor llame a Lacy Duverney principal al [phone] para una actualizacin sobre el [place] de cualquier retraso o cierre.  Consejos para la medicacin en dermatologa: Por favor, guarde las cajas en las que vienen los medicamentos de uso tpico para ayudarle a seguir las instrucciones sobre dnde y cmo usarlos. Las farmacias generalmente imprimen las instrucciones del medicamento slo en las cajas y no directamente en los tubos del [place].   Si su medicamento es  muy caro, por favor, pngase en contacto con Rolm Gala llamando al [phone] y presione la opcin 4 o envenos un mensaje a travs de Clinical cytogeneticist.   No podemos decirle cul ser su copago por los medicamentos por adelantado ya que esto es diferente dependiendo de la cobertura de su seguro. Sin embargo, es posible que podamos encontrar un medicamento sustituto a Audiological scientist un formulario para que el seguro cubra el medicamento que se considera necesario.   Si se requiere una autorizacin previa para que su compaa de seguros [place] su medicamento, por favor permtanos de 1 a 2 das hbiles para completar [address]  Los precios de los medicamentos varan con frecuencia dependiendo del Environmental consultant de dnde se surte la receta y alguna farmacias pueden ofrecer precios ms baratos.  El sitio web www.goodrx.com tiene cupones para medicamentos de Health and safety inspector. Los precios aqu no tienen en cuenta lo que podra costar con la ayuda del seguro (puede ser  ms barato con su seguro), pero el sitio web puede darle el precio si no Visual merchandiser.  - Puede imprimir el cupn correspondiente y llevarlo con su receta a la farmacia.  - Tambin puede pasar por nuestra oficina durante el horario de atencin regular y Education officer, museum una tarjeta de cupones de GoodRx.  - Si necesita que su receta se enve electrnicamente a una farmacia diferente, informe a nuestra oficina a travs de MyChart de [place] o por telfono llamando al [phone] y presione la opcin 4.

## 2023-07-07 ENCOUNTER — Encounter: Payer: Self-pay | Admitting: Dermatology

## 2023-07-13 ENCOUNTER — Other Ambulatory Visit: Payer: Self-pay

## 2023-07-13 MED ORDER — CLINDAMYCIN PHOSPHATE 1 % EX FOAM: 1.0000 "application " | Freq: Every day | CUTANEOUS | 4 refills | Status: DC

## 2023-07-13 NOTE — Progress Notes (Signed)
Clindamycin foam to [employer]

## 2023-07-16 ENCOUNTER — Encounter: Payer: Self-pay | Admitting: Dermatology

## 2023-07-17 ENCOUNTER — Other Ambulatory Visit: Payer: Self-pay

## 2023-07-17 MED ORDER — CLINDAMYCIN PHOSPHATE 1 % EX SOLN: CUTANEOUS | 11 refills | Status: DC

## 2023-07-17 NOTE — Progress Notes (Signed)
Fax received from pharmacy that Clindamycin foam not covered, Ok to substitute solution instead?

## 2023-07-26 ENCOUNTER — Telehealth: Payer: Self-pay

## 2023-07-26 ENCOUNTER — Ambulatory Visit: Payer: 59 | Admitting: Pharmacist

## 2023-07-26 NOTE — Progress Notes (Deleted)
 HPI: Michele Hodge is a 38 y.o. female who presents to the [facility] pharmacy clinic for Apretude  administration and HIV PrEP follow up.  Patient Active Problem List   Diagnosis Date Noted   Prediabetes 05/05/2022   Mixed hyperlipidemia 09/17/2021   Class 1 obesity due to excess calories with body mass index (BMI) of 33.0 to 33.9 in adult 09/16/2021   ADHD 09/16/2021   Gastroesophageal reflux disease 04/17/2017    Patient's Medications  New Prescriptions   No medications on file  Previous Medications   AMPHETAMINE-DEXTROAMPHETAMINE (ADDERALL) 10 MG TABLET    Take 10 mg by mouth every morning.   ATOMOXETINE (STRATTERA) 60 MG CAPSULE    Take 60 mg by mouth daily.   ATORVASTATIN  (LIPITOR) 20 MG TABLET    Take 1 tablet (20 mg total) by mouth daily.   CLINDAMYCIN  (CLEOCIN  T) 1 % EXTERNAL SOLUTION    Apply to the chest, back, and buttock QD after showering.   DOXYCYCLINE  (MONODOX ) 100 MG CAPSULE    Take 1 capsule (100 mg total) by mouth daily. Take with food and drink   KETOCONAZOLE  (NIZORAL ) 2 % SHAMPOO    Apply 1 Application topically 3 (three) times a week. Use as body wash 3 days a week washing scalp, face, body let sit 5 minutes and rinse off, avoid eyes   LEVONORGESTREL (MIRENA) 20 MCG/DAY IUD    by Intrauterine route.  Modified Medications   No medications on file  Discontinued Medications   No medications on file    Allergies: No Known Allergies  Past Medical History: Past Medical History:  Diagnosis Date   Healthy adult on routine physical examination     Social History: Social History   Socioeconomic History   Marital status: Single    Spouse name: Not on file   Number of children: Not on file   Years of education: Not on file   Highest education level: Bachelor's degree (e.g., BA, AB, BS)  Occupational History   Not on file  Tobacco Use   Smoking status: Never   Smokeless tobacco: Never  Vaping Use   Vaping status: Never Used  Substance and Sexual Activity    Alcohol use: Yes    Alcohol/week: 1.0 standard drink of alcohol    Types: 1 Glasses of wine per week    Comment: occasional use   Drug use: Not Currently    Types: Marijuana   Sexual activity: Never  Other Topics Concern   Not on file  Social History Narrative   Not on file   Social Drivers of Health   Financial Resource Strain: High Risk (04/24/2023)   Overall Financial Resource Strain (CARDIA)    Difficulty of Paying Living Expenses: Hard  Food Insecurity: Food Insecurity Present (04/24/2023)   Hunger Vital Sign    Worried About Running Out of Food in the Last Year: Often true    Ran Out of Food in the Last Year: Sometimes true  Transportation Needs: No Transportation Needs (04/24/2023)   PRAPARE - Administrator, Civil Service (Medical): No    Lack of Transportation (Non-Medical): No  Physical Activity: Insufficiently Active (04/24/2023)   Exercise Vital Sign    Days of Exercise per Week: 4 days    Minutes of Exercise per Session: 20 min  Stress: Stress Concern Present (04/24/2023)   [employer] of Occupational [employer] - Occupational Stress Questionnaire    Feeling of Stress : Rather much  Social Connections: Socially Isolated (04/24/2023)  Social Connection and Isolation Panel [NHANES]    Frequency of Communication with Friends and Family: More than three times a week    Frequency of Social Gatherings with Friends and Family: Twice a week    Attends Religious Services: Never    Database Administrator or Organizations: No    Attends Engineer, Structural: Not on file    Marital Status: Never married    Labs: Lab Results  Component Value Date   HIV1RNAQUANT Not Detected 05/25/2023   HIV1RNAQUANT Not Detected 03/21/2023   HIV1RNAQUANT Not Detected 01/26/2023    RPR and STI Lab Results  Component Value Date   LABRPR NON-REACTIVE 05/25/2023   LABRPR NON-REACTIVE 01/26/2023   LABRPR NON-REACTIVE 09/28/2022   LABRPR NON-REACTIVE 08/05/2022    LABRPR NON-REACTIVE 02/17/2021    STI Results GC GC CT CT  05/25/2023  2:08 PM Negative   Negative    11/28/2022  3:35 PM Negative   Negative    09/28/2022  2:21 PM Negative    Negative   Negative    Negative    08/02/2022  3:13 PM Negative    Negative   Negative    Negative    06/29/2022 10:31 AM Negative   Negative    11/24/2021  2:24 PM Negative   Negative    09/16/2021  3:06 PM Negative   Negative    02/17/2021  2:41 PM Negative   Negative    12/22/2020  3:20 PM Negative   Negative    02/05/2019 12:00 AM Negative   Negative    04/17/2017  5:00 PM   Negative    12/26/2016 11:52 AM  NOT DETECTED   NOT DETECTED     Hepatitis B Lab Results  Component Value Date   HEPBSAB REACTIVE (A) 02/17/2021   HEPBSAG NON-REACTIVE 02/17/2021   Hepatitis C Lab Results  Component Value Date   HEPCAB NON-REACTIVE 02/17/2021   Hepatitis A Lab Results  Component Value Date   HAV REACTIVE (A) 02/17/2021   Lipids: Lab Results  Component Value Date   CHOL 144 04/27/2023   TRIG 209 (H) 04/27/2023   HDL 44 (L) 04/27/2023   CHOLHDL 3.3 04/27/2023   VLDL 33 (H) 12/26/2016   LDLCALC 70 04/27/2023    TARGET DATE: The 10th of the month  Assessment: Michele Hodge presents today for their Apretude  injection and to follow up for HIV PrEP. No issues with past injections.  Screened patient for acute HIV symptoms such as fatigue, muscle aches, rash, sore throat, lymphadenopathy, headache, night sweats, nausea/vomiting/diarrhea, and fever. Patient denies any symptoms.   Per [employer] guidelines, a rapid HIV test should be drawn prior to Apretude  administration. Due to state shortage of rapid HIV tests, this is temporarily unable to be done. Per decision from [facility] physicians, we will proceed with Apretude  administration at this time without a negative rapid HIV test beforehand. HIV RNA was collected today and is in process.  Administered cabotegravir  600mg /62mL in *** upper outer quadrant of the gluteal  muscle. Will make follow up appointments for maintenance injections every 2 months.   No known exposures to any STIs and no signs or symptoms of any STIs today. Last STI screening was in November and was negative. *** new partners since last injection; will check *** today.   Plan: - Administer Apretude  600 mg x 1  - Maintenance injections scheduled for *** - Check HIV RNA and *** - Call with any issues or questions  Alan Geralds,  PharmD, CPP, BCIDP, AAHIVP Clinical Pharmacist Practitioner Infectious Diseases Clinical Pharmacist [facility]

## 2023-07-26 NOTE — Telephone Encounter (Signed)
 Call [phone] to schedule Sleep Test. Informed patient via MyChart.

## 2023-08-02 ENCOUNTER — Other Ambulatory Visit: Payer: Self-pay

## 2023-08-02 ENCOUNTER — Ambulatory Visit: Payer: 59 | Admitting: Pharmacist

## 2023-08-02 ENCOUNTER — Ambulatory Visit (INDEPENDENT_AMBULATORY_CARE_PROVIDER_SITE_OTHER): Payer: 59 | Admitting: Pharmacist

## 2023-08-02 DIAGNOSIS — Z113 Encounter for screening for infections with a predominantly sexual mode of transmission: Secondary | ICD-10-CM

## 2023-08-02 DIAGNOSIS — Z2981 Encounter for HIV pre-exposure prophylaxis: Secondary | ICD-10-CM | POA: Diagnosis not present

## 2023-08-02 DIAGNOSIS — Z79899 Other long term (current) drug therapy: Secondary | ICD-10-CM

## 2023-08-02 MED ORDER — CABOTEGRAVIR ER 600 MG/3ML IM SUER
600.0000 mg | Freq: Once | INTRAMUSCULAR | Status: AC
  Administered 2023-08-02: 600 mg via INTRAMUSCULAR

## 2023-08-02 NOTE — Progress Notes (Signed)
 HPI: Michele Hodge is a 38 y.o. female who presents to the [facility] pharmacy clinic for Apretude  administration and HIV PrEP follow up.  Patient Active Problem List   Diagnosis Date Noted   Prediabetes 05/05/2022   Mixed hyperlipidemia 09/17/2021   Class 1 obesity due to excess calories with body mass index (BMI) of 33.0 to 33.9 in adult 09/16/2021   ADHD 09/16/2021   Gastroesophageal reflux disease 04/17/2017    Patient's Medications  New Prescriptions   No medications on file  Previous Medications   AMPHETAMINE-DEXTROAMPHETAMINE (ADDERALL) 10 MG TABLET    Take 10 mg by mouth every morning.   ATOMOXETINE (STRATTERA) 60 MG CAPSULE    Take 60 mg by mouth daily.   ATORVASTATIN  (LIPITOR) 20 MG TABLET    Take 1 tablet (20 mg total) by mouth daily.   CLINDAMYCIN  (CLEOCIN  T) 1 % EXTERNAL SOLUTION    Apply to the chest, back, and buttock QD after showering.   DOXYCYCLINE  (MONODOX ) 100 MG CAPSULE    Take 1 capsule (100 mg total) by mouth daily. Take with food and drink   KETOCONAZOLE  (NIZORAL ) 2 % SHAMPOO    Apply 1 Application topically 3 (three) times a week. Use as body wash 3 days a week washing scalp, face, body let sit 5 minutes and rinse off, avoid eyes   LEVONORGESTREL (MIRENA) 20 MCG/DAY IUD    by Intrauterine route.  Modified Medications   No medications on file  Discontinued Medications   No medications on file    Allergies: No Known Allergies  Past Medical History: Past Medical History:  Diagnosis Date   Healthy adult on routine physical examination     Social History: Social History   Socioeconomic History   Marital status: Single    Spouse name: Not on file   Number of children: Not on file   Years of education: Not on file   Highest education level: Bachelor's degree (e.g., BA, AB, BS)  Occupational History   Not on file  Tobacco Use   Smoking status: Never   Smokeless tobacco: Never  Vaping Use   Vaping status: Never Used  Substance and Sexual Activity    Alcohol use: Yes    Alcohol/week: 1.0 standard drink of alcohol    Types: 1 Glasses of wine per week    Comment: occasional use   Drug use: Not Currently    Types: Marijuana   Sexual activity: Never  Other Topics Concern   Not on file  Social History Narrative   Not on file   Social Drivers of Health   Financial Resource Strain: High Risk (04/24/2023)   Overall Financial Resource Strain (CARDIA)    Difficulty of Paying Living Expenses: Hard  Food Insecurity: Food Insecurity Present (04/24/2023)   Hunger Vital Sign    Worried About Running Out of Food in the Last Year: Often true    Ran Out of Food in the Last Year: Sometimes true  Transportation Needs: No Transportation Needs (04/24/2023)   PRAPARE - Administrator, Civil Service (Medical): No    Lack of Transportation (Non-Medical): No  Physical Activity: Insufficiently Active (04/24/2023)   Exercise Vital Sign    Days of Exercise per Week: 4 days    Minutes of Exercise per Session: 20 min  Stress: Stress Concern Present (04/24/2023)   [employer] of Occupational Health - Occupational Stress Questionnaire    Feeling of Stress : Rather much  Social Connections: Socially Isolated (04/24/2023)  Social Connection and Isolation Panel [NHANES]    Frequency of Communication with Friends and Family: More than three times a week    Frequency of Social Gatherings with Friends and Family: Twice a week    Attends Religious Services: Never    Database administrator or Organizations: No    Attends Engineer, structural: Not on file    Marital Status: Never married    Labs: Lab Results  Component Value Date   HIV1RNAQUANT Not Detected 05/25/2023   HIV1RNAQUANT Not Detected 03/21/2023   HIV1RNAQUANT Not Detected 01/26/2023    RPR and STI Lab Results  Component Value Date   LABRPR NON-REACTIVE 05/25/2023   LABRPR NON-REACTIVE 01/26/2023   LABRPR NON-REACTIVE 09/28/2022   LABRPR NON-REACTIVE 08/05/2022    LABRPR NON-REACTIVE 02/17/2021    STI Results GC GC CT CT  05/25/2023  2:08 PM Negative   Negative    11/28/2022  3:35 PM Negative   Negative    09/28/2022  2:21 PM Negative    Negative   Negative    Negative    08/02/2022  3:13 PM Negative    Negative   Negative    Negative    06/29/2022 10:31 AM Negative   Negative    11/24/2021  2:24 PM Negative   Negative    09/16/2021  3:06 PM Negative   Negative    02/17/2021  2:41 PM Negative   Negative    12/22/2020  3:20 PM Negative   Negative    02/05/2019 12:00 AM Negative   Negative    04/17/2017  5:00 PM   Negative    12/26/2016 11:52 AM  NOT DETECTED   NOT DETECTED     Hepatitis B Lab Results  Component Value Date   HEPBSAB REACTIVE (A) 02/17/2021   HEPBSAG NON-REACTIVE 02/17/2021   Hepatitis C Lab Results  Component Value Date   HEPCAB NON-REACTIVE 02/17/2021   Hepatitis A Lab Results  Component Value Date   HAV REACTIVE (A) 02/17/2021   Lipids: Lab Results  Component Value Date   CHOL 144 04/27/2023   TRIG 209 (H) 04/27/2023   HDL 44 (L) 04/27/2023   CHOLHDL 3.3 04/27/2023   VLDL 33 (H) 12/26/2016   LDLCALC 70 04/27/2023    TARGET DATE: The 10th of the month  Assessment: Michele Hodge presents today for their Apretude  injection and to follow up for HIV PrEP. No issues with past injections.  Screened patient for acute HIV symptoms such as fatigue, muscle aches, rash, sore throat, lymphadenopathy, headache, night sweats, nausea/vomiting/diarrhea, and fever. Patient denies any symptoms.   Per [employer] guidelines, a rapid HIV test should be drawn prior to Apretude  administration. Due to state shortage of rapid HIV tests, this is temporarily unable to be done. Per decision from [facility] physicians, we will proceed with Apretude  administration at this time without a negative rapid HIV test beforehand. HIV RNA was collected today and is in process.  Administered cabotegravir  600mg /65mL in left upper outer quadrant of the gluteal  muscle. Will make follow up appointments for maintenance injections every 2 months.   No known exposures to any STIs and no signs or symptoms of any STIs today. Last STI screening was in November and was negative. No new partners since last injection; politely declines STI testing today. States she is currently receiving metronidazole  500 mg x 7 days for bacterial vaginosis.    Plan: - Administer Apretude  600 mg x 1  - Maintenance injections scheduled for 3/5  with Cassie  - Check HIV RNA - Call with any issues or questions  Nicklas Barns, PharmD, CPP, BCIDP, AAHIVP Clinical Pharmacist Practitioner Infectious Diseases Clinical Pharmacist [facility]

## 2023-08-05 LAB — HIV-1 RNA QUANT-NO REFLEX-BLD
HIV 1 RNA Quant: NOT DETECTED {copies}/mL
HIV-1 RNA Quant, Log: NOT DETECTED {Log}

## 2023-08-31 ENCOUNTER — Other Ambulatory Visit (HOSPITAL_COMMUNITY): Payer: Self-pay

## 2023-09-15 ENCOUNTER — Encounter: Payer: Self-pay | Admitting: Internal Medicine

## 2023-09-20 ENCOUNTER — Ambulatory Visit: Payer: 59 | Admitting: Pharmacist

## 2023-09-26 NOTE — Progress Notes (Unsigned)
 HPI: Michele Hodge is a 38 y.o. female who presents to the [facility] pharmacy clinic for Apretude administration and HIV PrEP follow up.  Insured   [x]    Uninsured  []    Patient Active Problem List   Diagnosis Date Noted   Prediabetes 05/05/2022   Mixed hyperlipidemia 09/17/2021   Class 1 obesity due to excess calories with body mass index (BMI) of 33.0 to 33.9 in adult 09/16/2021   ADHD 09/16/2021   Gastroesophageal reflux disease 04/17/2017    Patient's Medications  New Prescriptions   No medications on file  Previous Medications   AMPHETAMINE-DEXTROAMPHETAMINE (ADDERALL) 10 MG TABLET    Take 10 mg by mouth every morning.   ATOMOXETINE (STRATTERA) 60 MG CAPSULE    Take 60 mg by mouth daily.   ATORVASTATIN (LIPITOR) 20 MG TABLET    Take 1 tablet (20 mg total) by mouth daily.   CLINDAMYCIN (CLEOCIN T) 1 % EXTERNAL SOLUTION    Apply to the chest, back, and buttock QD after showering.   DOXYCYCLINE (MONODOX) 100 MG CAPSULE    Take 1 capsule (100 mg total) by mouth daily. Take with food and drink   KETOCONAZOLE (NIZORAL) 2 % SHAMPOO    Apply 1 Application topically 3 (three) times a week. Use as body wash 3 days a week washing scalp, face, body let sit 5 minutes and rinse off, avoid eyes   LEVONORGESTREL (MIRENA) 20 MCG/DAY IUD    by Intrauterine route.  Modified Medications   No medications on file  Discontinued Medications   No medications on file    Allergies: No Known Allergies  Labs: Lab Results  Component Value Date   HIV1RNAQUANT Not Detected 08/02/2023   HIV1RNAQUANT Not Detected 05/25/2023   HIV1RNAQUANT Not Detected 03/21/2023    RPR and STI Lab Results  Component Value Date   LABRPR NON-REACTIVE 05/25/2023   LABRPR NON-REACTIVE 01/26/2023   LABRPR NON-REACTIVE 09/28/2022   LABRPR NON-REACTIVE 08/05/2022   LABRPR NON-REACTIVE 02/17/2021    STI Results GC GC CT CT  05/25/2023  2:08 PM Negative   Negative    11/28/2022  3:35 PM Negative   Negative     09/28/2022  2:21 PM Negative    Negative   Negative    Negative    08/02/2022  3:13 PM Negative    Negative   Negative    Negative    06/29/2022 10:31 AM Negative   Negative    11/24/2021  2:24 PM Negative   Negative    09/16/2021  3:06 PM Negative   Negative    02/17/2021  2:41 PM Negative   Negative    12/22/2020  3:20 PM Negative   Negative    02/05/2019 12:00 AM Negative   Negative    04/17/2017  5:00 PM   Negative    12/26/2016 11:52 AM  NOT DETECTED   NOT DETECTED     Hepatitis B Lab Results  Component Value Date   HEPBSAB REACTIVE (A) 02/17/2021   HEPBSAG NON-REACTIVE 02/17/2021   Hepatitis C Lab Results  Component Value Date   HEPCAB NON-REACTIVE 02/17/2021   Hepatitis A Lab Results  Component Value Date   HAV REACTIVE (A) 02/17/2021   Lipids: Lab Results  Component Value Date   CHOL 144 04/27/2023   TRIG 209 (H) 04/27/2023   HDL 44 (L) 04/27/2023   CHOLHDL 3.3 04/27/2023   VLDL 33 (H) 12/26/2016   LDLCALC 70 04/27/2023    TARGET DATE: The 10th  Assessment: Michele Hodge presents today for her Apretude injection and to follow up for HIV PrEP. No issues with past injections. Denies any symptoms of acute HIV. Declines STI testing today.   Per [employer] guidelines, a rapid HIV test should be drawn prior to Apretude administration. Due to state shortage of rapid HIV tests, this is temporarily unable to be done. Per decision from [facility] physicians, we will proceed with Apretude administration at this time without a negative rapid HIV test beforehand. HIV RNA was collected today and is in process.  Administered cabotegravir 600mg /16mL in left upper outer quadrant of the gluteal muscle. Will see her back in 2 months for injection, labs, and HIV PrEP follow up.  Plan: - Apretude injection administered - HIV RNA today - Next injection, labs, and PrEP follow up appointment scheduled for 11/22/23 and 01/24/24 - Call with any issues or questions  Charels Stambaugh L. Edynn Gillock,  PharmD, BCIDP, AAHIVP, CPP Clinical Pharmacist Practitioner Infectious Diseases Clinical Pharmacist [facility]

## 2023-09-27 ENCOUNTER — Other Ambulatory Visit: Payer: Self-pay

## 2023-09-27 ENCOUNTER — Ambulatory Visit (INDEPENDENT_AMBULATORY_CARE_PROVIDER_SITE_OTHER): Admitting: Pharmacist

## 2023-09-27 ENCOUNTER — Other Ambulatory Visit (HOSPITAL_COMMUNITY): Payer: Self-pay

## 2023-09-27 DIAGNOSIS — Z2981 Encounter for HIV pre-exposure prophylaxis: Secondary | ICD-10-CM | POA: Diagnosis not present

## 2023-09-27 DIAGNOSIS — Z79899 Other long term (current) drug therapy: Secondary | ICD-10-CM

## 2023-09-27 MED ORDER — CABOTEGRAVIR ER 600 MG/3ML IM SUER
600.0000 mg | Freq: Once | INTRAMUSCULAR | Status: AC
  Administered 2023-09-27: 600 mg via INTRAMUSCULAR

## 2023-09-28 ENCOUNTER — Telehealth: Payer: Self-pay

## 2023-09-28 ENCOUNTER — Other Ambulatory Visit (HOSPITAL_COMMUNITY): Payer: Self-pay

## 2023-09-28 NOTE — Telephone Encounter (Signed)
 Pharmacy Patient Advocate Encounter- Apretude BIV-Medical Benefit:  J code: [id number] CPT code: 19147  Dx Code: Z72.53  No PA was submitted to [employer]   Authorization# [id number]  NOTIFICATION WILL BE IN MEDIA.

## 2023-09-29 ENCOUNTER — Encounter: Payer: Self-pay | Admitting: Internal Medicine

## 2023-09-29 DIAGNOSIS — E782 Mixed hyperlipidemia: Secondary | ICD-10-CM

## 2023-09-29 DIAGNOSIS — R7303 Prediabetes: Secondary | ICD-10-CM

## 2023-09-29 LAB — HIV-1 RNA QUANT-NO REFLEX-BLD
HIV 1 RNA Quant: NOT DETECTED {copies}/mL
HIV-1 RNA Quant, Log: NOT DETECTED {Log_copies}/mL

## 2023-10-26 ENCOUNTER — Ambulatory Visit (INDEPENDENT_AMBULATORY_CARE_PROVIDER_SITE_OTHER): Payer: Self-pay | Admitting: Internal Medicine

## 2023-10-26 ENCOUNTER — Encounter: Payer: Self-pay | Admitting: Internal Medicine

## 2023-10-26 VITALS — BP 124/84 | Ht 71.0 in | Wt 238.2 lb

## 2023-10-26 DIAGNOSIS — R7303 Prediabetes: Secondary | ICD-10-CM

## 2023-10-26 DIAGNOSIS — G4733 Obstructive sleep apnea (adult) (pediatric): Secondary | ICD-10-CM

## 2023-10-26 DIAGNOSIS — E66811 Obesity, class 1: Secondary | ICD-10-CM

## 2023-10-26 DIAGNOSIS — K219 Gastro-esophageal reflux disease without esophagitis: Secondary | ICD-10-CM | POA: Diagnosis not present

## 2023-10-26 DIAGNOSIS — E782 Mixed hyperlipidemia: Secondary | ICD-10-CM

## 2023-10-26 DIAGNOSIS — Z6833 Body mass index (BMI) 33.0-33.9, adult: Secondary | ICD-10-CM

## 2023-10-26 DIAGNOSIS — E6609 Other obesity due to excess calories: Secondary | ICD-10-CM

## 2023-10-26 DIAGNOSIS — F902 Attention-deficit hyperactivity disorder, combined type: Secondary | ICD-10-CM

## 2023-10-26 NOTE — Assessment & Plan Note (Signed)
 C-Met and lipid profile today Encouraged her to consume low-fat diet May need to restart atorvastatin

## 2023-10-26 NOTE — Progress Notes (Signed)
 Subjective:    Patient ID: Michele Hodge, female    DOB: 24-Nov-1985, 38 y.o.   MRN: [id number]  HPI  Patient presents to clinic today for 13-month follow-up of chronic conditions.  GERD: Triggered by tomato based, spicy foods or eating and laying down.  She is not taking any medications for this.  There is no upper GI on file.  HLD: Her last LDL was 70, triglycerides 409, 04/2023.  She is not taking atorvastatin as prescribed.  She tries to consume low-fat diet.  ADHD: Managed with straterra and adderall.  She follows with psychiatry.  Prediabetes: Her last A1c was 6%, 04/2023.  She is not taking any oral diabetic medication at this time.  She does not check her sugars.    OSA: She averages 5  hours of sleep per night with the use of her CPAP. Sleep study from 06/2023 reviewed.   Review of Systems     Past Medical History:  Diagnosis Date   Healthy adult on routine physical examination     Current Outpatient Medications  Medication Sig Dispense Refill   amphetamine-dextroamphetamine (ADDERALL) 10 MG tablet Take 10 mg by mouth every morning.     atomoxetine (STRATTERA) 60 MG capsule Take 60 mg by mouth daily.     atorvastatin (LIPITOR) 20 MG tablet Take 1 tablet (20 mg total) by mouth daily. 90 tablet 1   clindamycin (CLEOCIN T) 1 % external solution Apply to the chest, back, and buttock QD after showering. 30 mL 11   doxycycline (MONODOX) 100 MG capsule Take 1 capsule (100 mg total) by mouth daily. Take with food and drink 90 capsule 1   ketoconazole (NIZORAL) 2 % shampoo Apply 1 Application topically 3 (three) times a week. Use as body wash 3 days a week washing scalp, face, body let sit 5 minutes and rinse off, avoid eyes 120 mL 5   levonorgestrel (MIRENA) 20 MCG/DAY IUD by Intrauterine route.     No current facility-administered medications for this visit.    No Known Allergies  Family History  Problem Relation Age of Onset   Diabetes Father 7   Stroke Maternal  Grandmother    CAD Maternal Grandfather    Stroke Paternal Grandmother    Breast cancer Cousin    Schizophrenia Brother 20   Colon cancer Neg Hx    Ovarian cancer Neg Hx     Social History   Socioeconomic History   Marital status: Single    Spouse name: Not on file   Number of children: Not on file   Years of education: Not on file   Highest education level: Bachelor's degree (e.g., BA, AB, BS)  Occupational History   Not on file  Tobacco Use   Smoking status: Never   Smokeless tobacco: Never  Vaping Use   Vaping status: Never Used  Substance and Sexual Activity   Alcohol use: Yes    Alcohol/week: 1.0 standard drink of alcohol    Types: 1 Glasses of wine per week    Comment: occasional use   Drug use: Not Currently    Types: Marijuana   Sexual activity: Never  Other Topics Concern   Not on file  Social History Narrative   Not on file   Social Drivers of Health   Financial Resource Strain: High Risk (04/24/2023)   Overall Financial Resource Strain (CARDIA)    Difficulty of Paying Living Expenses: Hard  Food Insecurity: Food Insecurity Present (04/24/2023)   Hunger Vital Sign  Worried About Programme researcher, broadcasting/film/video in the Last Year: Often true    Ran Out of Food in the Last Year: Sometimes true  Transportation Needs: No Transportation Needs (04/24/2023)   PRAPARE - Administrator, Civil Service (Medical): No    Lack of Transportation (Non-Medical): No  Physical Activity: Insufficiently Active (04/24/2023)   Exercise Vital Sign    Days of Exercise per Week: 4 days    Minutes of Exercise per Session: 20 min  Stress: Stress Concern Present (04/24/2023)   [employer] of Occupational Health - Occupational Stress Questionnaire    Feeling of Stress : Rather much  Social Connections: Socially Isolated (04/24/2023)   Social Connection and Isolation Panel [NHANES]    Frequency of Communication with Friends and Family: More than three times a week    Frequency  of Social Gatherings with Friends and Family: Twice a week    Attends Religious Services: Never    Database administrator or Organizations: No    Attends Engineer, structural: Not on file    Marital Status: Never married  Intimate Partner Violence: Not on file     Constitutional: Denies fever, malaise, fatigue, headache or abrupt weight changes.  HEENT: Denies eye pain, eye redness, ear pain, ringing in the ears, wax buildup, runny nose, nasal congestion, bloody nose, or sore throat. Respiratory: Denies difficulty breathing, shortness of breath, cough or sputum production.   Cardiovascular: Denies chest pain, chest tightness, palpitations or swelling in the hands or feet.  Gastrointestinal: Patient reports intermittent reflux.  Denies abdominal pain, bloating, constipation, diarrhea or blood in the stool.  GU: Denies urgency, frequency, pain with urination, burning sensation, blood in urine, odor or discharge. Musculoskeletal: Denies decrease in range of motion, difficulty with gait, muscle pain or joint pain and swelling.  Skin:  Denies redness, lesions or ulcercations.  Neurological: Patient reports inattention.  Denies dizziness, difficulty with memory, difficulty with speech or problems with balance and coordination.  Psych: Denies anxiety, depression, SI/HI.  No other specific complaints in a complete review of systems (except as listed in HPI above).  Objective:   Physical Exam BP 124/84 (BP Location: Left Arm, Patient Position: Sitting, Cuff Size: Large)   Ht 5\' 11"  (1.803 m)   Wt 238 lb 3.2 oz (108 kg)   LMP  (LMP Unknown)   BMI 33.22 kg/m   Wt Readings from Last 3 Encounters:  04/27/23 238 lb (108 kg)  06/29/22 233 lb (105.7 kg)  05/05/22 234 lb (106.1 kg)    General: Appears her stated age, obese, in NAD. Skin: Warm, dry and intact.   HEENT: Head: normal shape and size; Eyes: sclera white, no icterus, conjunctiva pink, PERRLA and EOMs intact;   Cardiovascular: Normal rate and rhythm. S1,S2 noted.  No murmur, rubs or gallops noted.  Pulmonary/Chest: Normal effort and positive vesicular breath sounds. No respiratory distress. No wheezes, rales or ronchi noted.  Abdomen: Normal bowel sounds.  Musculoskeletal: No difficulty with gait.  Neurological: Alert and oriented.    BMET    Component Value Date/Time   NA 139 04/27/2023 1342   K 3.9 04/27/2023 1342   CL 106 04/27/2023 1342   CO2 23 04/27/2023 1342   GLUCOSE 89 04/27/2023 1342   BUN 13 04/27/2023 1342   CREATININE 0.79 04/27/2023 1342   CALCIUM 9.7 04/27/2023 1342   GFRNONAA 91 02/06/2019 0853   GFRAA 106 02/06/2019 0853    Lipid Panel     Component  Value Date/Time   CHOL 144 04/27/2023 1342   TRIG 209 (H) 04/27/2023 1342   HDL 44 (L) 04/27/2023 1342   CHOLHDL 3.3 04/27/2023 1342   VLDL 33 (H) 12/26/2016 0909   LDLCALC 70 04/27/2023 1342    CBC    Component Value Date/Time   WBC 8.9 04/27/2023 1342   RBC 4.65 04/27/2023 1342   HGB 14.1 04/27/2023 1342   HCT 42.1 04/27/2023 1342   PLT 318 04/27/2023 1342   MCV 90.5 04/27/2023 1342   MCH 30.3 04/27/2023 1342   MCHC 33.5 04/27/2023 1342   RDW 11.9 04/27/2023 1342   LYMPHSABS 3,434 02/06/2019 0853   MONOABS 0.5 12/29/2017 0308   EOSABS 111 02/06/2019 0853   BASOSABS 68 02/06/2019 0853    Hgb A1C Lab Results  Component Value Date   HGBA1C 6.0 (H) 04/27/2023           Assessment & Plan:    RTC in 6 months for your annual exam [name], NP

## 2023-10-26 NOTE — Assessment & Plan Note (Signed)
 Continue strattera and adderall as prescribed by psychiatry

## 2023-10-26 NOTE — Assessment & Plan Note (Signed)
 Avoid foods that trigger reflux Encourage weight loss as this can help reduce reflux symptoms Ok to take tums as needed

## 2023-10-26 NOTE — Assessment & Plan Note (Signed)
 A1c today Encourage low-carb diet and exercise for weight loss

## 2023-10-26 NOTE — Assessment & Plan Note (Signed)
 Encouraged diet and exercise for weight loss ?

## 2023-10-26 NOTE — Patient Instructions (Signed)

## 2023-10-26 NOTE — Assessment & Plan Note (Signed)
Encouraged weight loss as this can help reduce sleep apnea symptoms Continue CPAP 

## 2023-10-31 ENCOUNTER — Encounter: Payer: Self-pay | Admitting: Internal Medicine

## 2023-10-31 LAB — COMPREHENSIVE METABOLIC PANEL WITH GFR
AG Ratio: 1.8 (calc) (ref 1.0–2.5)
ALT: 24 U/L (ref 6–29)
AST: 18 U/L (ref 10–30)
Albumin: 4.6 g/dL (ref 3.6–5.1)
Alkaline phosphatase (APISO): 49 U/L (ref 31–125)
BUN: 10 mg/dL (ref 7–25)
CO2: 28 mmol/L (ref 20–32)
Calcium: 9.6 mg/dL (ref 8.6–10.2)
Chloride: 102 mmol/L (ref 98–110)
Creat: 0.71 mg/dL (ref 0.50–0.97)
Globulin: 2.6 g/dL (ref 1.9–3.7)
Glucose, Bld: 95 mg/dL (ref 65–99)
Potassium: 4.2 mmol/L (ref 3.5–5.3)
Sodium: 137 mmol/L (ref 135–146)
Total Bilirubin: 0.6 mg/dL (ref 0.2–1.2)
Total Protein: 7.2 g/dL (ref 6.1–8.1)
eGFR: 112 mL/min/{1.73_m2} (ref >=60)

## 2023-10-31 LAB — CBC
HCT: 41.4 % (ref 35.0–45.0)
Hemoglobin: 13.9 g/dL (ref 11.7–15.5)
MCH: 30.2 pg (ref 27.0–33.0)
MCHC: 33.6 g/dL (ref 32.0–36.0)
MCV: 89.8 fL (ref 80.0–100.0)
MPV: 9.4 fL (ref 7.5–12.5)
Platelets: 312 10*3/uL (ref 140–400)
RBC: 4.61 10*6/uL (ref 3.80–5.10)
RDW: 12.2 % (ref 11.0–15.0)
WBC: 8.3 10*3/uL (ref 3.8–10.8)

## 2023-10-31 LAB — HEMOGLOBIN A1C
Hgb A1c MFr Bld: 5.8 % — ABNORMAL HIGH (ref <5.7)
Mean Plasma Glucose: 120 mg/dL
eAG (mmol/L): 6.6 mmol/L

## 2023-10-31 LAB — LIPID PANEL
Cholesterol: 187 mg/dL (ref <200)
HDL: 51 mg/dL (ref >=50)
LDL Cholesterol (Calc): 107 mg/dL — ABNORMAL HIGH
Non-HDL Cholesterol (Calc): 136 mg/dL — ABNORMAL HIGH (ref <130)
Total CHOL/HDL Ratio: 3.7 (calc) (ref <5.0)
Triglycerides: 170 mg/dL — ABNORMAL HIGH (ref <150)

## 2023-11-22 ENCOUNTER — Other Ambulatory Visit: Payer: Self-pay

## 2023-11-22 ENCOUNTER — Other Ambulatory Visit (HOSPITAL_COMMUNITY)
Admission: RE | Admit: 2023-11-22 | Discharge: 2023-11-22 | Disposition: A | Discharge status: Home / Self Care | Source: Ambulatory Visit | Attending: Infectious Disease | Admitting: Infectious Disease

## 2023-11-22 ENCOUNTER — Ambulatory Visit (INDEPENDENT_AMBULATORY_CARE_PROVIDER_SITE_OTHER): Admitting: Pharmacist

## 2023-11-22 DIAGNOSIS — Z2981 Encounter for HIV pre-exposure prophylaxis: Secondary | ICD-10-CM

## 2023-11-22 DIAGNOSIS — Z113 Encounter for screening for infections with a predominantly sexual mode of transmission: Secondary | ICD-10-CM | POA: Diagnosis present

## 2023-11-22 DIAGNOSIS — Z79899 Other long term (current) drug therapy: Secondary | ICD-10-CM

## 2023-11-22 MED ORDER — CABOTEGRAVIR ER 600 MG/3ML IM SUER
600.0000 mg | Freq: Once | INTRAMUSCULAR | Status: AC
Start: 2023-11-22 — End: 2023-11-22
  Administered 2023-11-22: 600 mg via INTRAMUSCULAR

## 2023-11-22 NOTE — Progress Notes (Unsigned)
 HPI: Michele Hodge is a 38 y.o. female who presents to the [facility] pharmacy clinic for Apretude  administration and HIV PrEP follow up.  Insured   [x]    Uninsured  []    Patient Active Problem List   Diagnosis Date Noted   OSA (obstructive sleep apnea) 10/26/2023   Prediabetes 05/05/2022   Mixed hyperlipidemia 09/17/2021   Class 1 obesity due to excess calories with body mass index (BMI) of 33.0 to 33.9 in adult 09/16/2021   ADHD 09/16/2021   Gastroesophageal reflux disease 04/17/2017    Patient's Medications  New Prescriptions   No medications on file  Previous Medications   AMPHETAMINE-DEXTROAMPHETAMINE (ADDERALL) 10 MG TABLET    Take 10 mg by mouth every morning.   ATOMOXETINE (STRATTERA) 60 MG CAPSULE    Take 60 mg by mouth daily.   ATORVASTATIN  (LIPITOR) 20 MG TABLET    Take 1 tablet (20 mg total) by mouth daily.   CLINDAMYCIN  (CLEOCIN  T) 1 % EXTERNAL SOLUTION    Apply to the chest, back, and buttock QD after showering.   KETOCONAZOLE  (NIZORAL ) 2 % SHAMPOO    Apply 1 Application topically 3 (three) times a week. Use as body wash 3 days a week washing scalp, face, body let sit 5 minutes and rinse off, avoid eyes   LEVONORGESTREL (MIRENA) 20 MCG/DAY IUD    by Intrauterine route.  Modified Medications   No medications on file  Discontinued Medications   No medications on file    Allergies: No Known Allergies  Labs: Lab Results  Component Value Date   HIV1RNAQUANT NOT DETECTED 09/27/2023   HIV1RNAQUANT Not Detected 08/02/2023   HIV1RNAQUANT Not Detected 05/25/2023    RPR and STI Lab Results  Component Value Date   LABRPR NON-REACTIVE 05/25/2023   LABRPR NON-REACTIVE 01/26/2023   LABRPR NON-REACTIVE 09/28/2022   LABRPR NON-REACTIVE 08/05/2022   LABRPR NON-REACTIVE 02/17/2021    STI Results GC GC CT CT  05/25/2023  2:08 PM Negative   Negative    11/28/2022  3:35 PM Negative   Negative    09/28/2022  2:21 PM Negative    Negative   Negative    Negative     08/02/2022  3:13 PM Negative    Negative   Negative    Negative    06/29/2022 10:31 AM Negative   Negative    11/24/2021  2:24 PM Negative   Negative    09/16/2021  3:06 PM Negative   Negative    02/17/2021  2:41 PM Negative   Negative    12/22/2020  3:20 PM Negative   Negative    02/05/2019 12:00 AM Negative   Negative    04/17/2017  5:00 PM   Negative    12/26/2016 11:52 AM  NOT DETECTED   NOT DETECTED     Hepatitis B Lab Results  Component Value Date   HEPBSAB REACTIVE (A) 02/17/2021   HEPBSAG NON-REACTIVE 02/17/2021   Hepatitis C Lab Results  Component Value Date   HEPCAB NON-REACTIVE 02/17/2021   Hepatitis A Lab Results  Component Value Date   HAV REACTIVE (A) 02/17/2021   Lipids: Lab Results  Component Value Date   CHOL 187 10/30/2023   TRIG 170 (H) 10/30/2023   HDL 51 10/30/2023   CHOLHDL 3.7 10/30/2023   VLDL 33 (H) 12/26/2016   LDLCALC 107 (H) 10/30/2023    TARGET DATE: The 10th  Assessment: Michele Hodge presents today for her Apretude  injection and to follow up for HIV PrEP. No issues  with past injections. Denies any symptoms of acute HIV. Last STI screening was on 05/25/23 and was negative. No known exposures to any STIs since last visit but agrees to STI testing today.    Per [employer] guidelines, a rapid HIV test should be drawn prior to Apretude  administration. Due to state shortage of rapid HIV tests, this is temporarily unable to be done. Per decision from [facility] physicians, we will proceed with Apretude  administration at this time without a negative rapid HIV test beforehand. HIV RNA was collected today and is in process.  Administered cabotegravir  600mg /15mL in left upper outer quadrant of the gluteal muscle. Will see her back in 2 months for injection, labs, and HIV PrEP follow up.  Plan: - Apretude  injection administered - HIV RNA and urine cytology today (RPR was accidentally not drawn; will get at next appt) - Next injection, labs, and PrEP follow up  appointment scheduled for 01/24/24 - Call with any issues or questions  Michele Hodge, PharmD, BCIDP, AAHIVP, CPP Clinical Pharmacist Practitioner - Infectious Diseases Clinical Pharmacist Lead - Specialty Pharmacy [facility] 11/22/2023, 9:59 AM

## 2023-11-23 LAB — URINE CYTOLOGY ANCILLARY ONLY
Chlamydia: NEGATIVE
Comment: NEGATIVE
Comment: NEGATIVE
Comment: NORMAL
Neisseria Gonorrhea: NEGATIVE
Trichomonas: NEGATIVE

## 2023-11-24 LAB — HIV-1 RNA QUANT-NO REFLEX-BLD
HIV 1 RNA Quant: NOT DETECTED {copies}/mL
HIV-1 RNA Quant, Log: NOT DETECTED {Log_copies}/mL

## 2023-11-29 ENCOUNTER — Encounter: Payer: Self-pay | Admitting: Internal Medicine

## 2023-12-12 ENCOUNTER — Ambulatory Visit: Admitting: Dermatology

## 2023-12-12 ENCOUNTER — Encounter: Payer: Self-pay | Admitting: Dermatology

## 2023-12-12 DIAGNOSIS — Z7189 Other specified counseling: Secondary | ICD-10-CM

## 2023-12-12 DIAGNOSIS — L299 Pruritus, unspecified: Secondary | ICD-10-CM

## 2023-12-12 DIAGNOSIS — Z79899 Other long term (current) drug therapy: Secondary | ICD-10-CM

## 2023-12-12 DIAGNOSIS — L739 Follicular disorder, unspecified: Secondary | ICD-10-CM

## 2023-12-12 MED ORDER — CLINDAMYCIN PHOSPHATE 1 % EX SOLN: CUTANEOUS | 11 refills | Status: AC

## 2023-12-12 MED ORDER — KETOCONAZOLE 2 % EX SHAM
1.0000 | MEDICATED_SHAMPOO | CUTANEOUS | 11 refills | Status: DC
Start: 2023-12-13 — End: 2024-04-30

## 2023-12-12 NOTE — Progress Notes (Signed)
 Follow-Up Visit   Subjective  Michele Hodge is a 38 y.o. female who presents for the following: patient here for follow up 6 month follow up, patient has been using ketoconazole  shampoo and clindamycin  solution to affected areas on back and chest. Patient reports she did not feel comfortable taking doxycycline  100 tablet and had sent mychart message to request different treatment. She feels current treatment is helping some.   The patient has spots, moles and lesions to be evaluated, some may be new or changing and the patient may have concern these could be cancer.  The following portions of the chart were reviewed this encounter and updated as appropriate: medications, allergies, medical history  Review of Systems:  No other skin or systemic complaints except as noted in HPI or Assessment and Plan.  Objective  Well appearing patient in no apparent distress; mood and affect are within normal limits.  A focused examination was performed of the following areas: Back, buttock, chest   Relevant exam findings are noted in the Assessment and Plan.              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 Buttocks, back, chest Sterile vs bacterial vs yeast folliculitis -consider biopsy in the future if not improving. Exam: spotty hyperpigmentation back, buttocks, chest, active small paps and comedones back has improved  - see photos   Chronic and persistent condition with duration or expected duration over one year. Condition is improving with treatment but not currently at goal.  Folliculitis occurs due to inflammation of the superficial hair follicle (pore), resulting in acne-like lesions (pus bumps). It can be infectious (bacterial, fungal) or noninfectious (shaving, tight clothing, heat/sweat, medications).  Folliculitis can be acute or chronic  and recommended treatment depends on the underlying cause of folliculitis.   Treatment Plan: Patient is still flared and would like to discuss treatments   Discussed treatments   1 option to continue topical treatment only  Ketoconazole  2 % shampoo as body wash to scalp, face, and body 3d/wk, let sit 5 minutes and rinse off, avoid eyes and Clindamycin  solution to affected bumps daily after shower    2nd option to continue topicals and start either doxycycline  100 mg 1 po daily or Diflucan  200 mg po once weekly  Diflucan    3rd option  Discussed can do biopsy to r/o condition   4th option  Discussed could start Accutane  Isotretinoin Counseling; Review and Contraception Counseling: Reviewed potential side effects of isotretinoin including xerosis, cheilitis, hepatitis, hyperlipidemia, and severe birth defects if taken by a pregnant woman.  Women on isotretinoin must be celibate (not having sex) or required to use at least 2 birth control methods to prevent pregnancy (unless patient is a female of non-child bearing potential).  Females of child-bearing potential must have monthly pregnancy tests while on isotretinoin and report through [employer] (FDA monitoring program). Reviewed reports of suicidal ideation in those with a history of depression while taking isotretinoin and reports of diagnosis of inflammatory bowl disease (IBD) while taking isotretinoin as well as the lack of evidence for a causal relationship between isotretinoin, depression and IBD. Patient advised to reach out with any questions or concerns. Patient advised not to share pills or donate blood while on treatment or for one month after completing treatment. All patient's considering Isotretinoin must read and understand and sign Isotretinoin Consent Form and be registered with [employer].  5th option  Discussed can continue current topicals Ketoconazole  shampoo and clindamycin  solution and add sulfar wash from skin medicinals.    Patient would like to do 5th option = continue topical treatments and add [employer] sulfur wash from [employer].   Continue Ketoconazole  2 % shampoo as body wash to scalp, face, and body 3d/wk, let sit 5 minutes and rinse off  Continue Clindamycin  1 %  solution - apply topically to affected areas after shower daily to bumps   Start [employer] Sulfur Foaming Wash  Sulfacetamide 9% Sulfur 3% Vehicle Foaming Wash Size Vehicle Instructions: Gently wash affected areas once daily alternating on days not using ketoconazole  shampoo   FOLLICULITIS   Related Medications clindamycin  (CLEOCIN  T) 1 % external solution Apply to the chest, back, and buttock QD after showering. ketoconazole  (NIZORAL ) 2 % shampoo Apply 1 Application topically 3 (three) times a week. Use as body wash 3 days a week washing scalp, face, body let sit 5 minutes and rinse off, avoid eyes  Return for 4 month folliculitis follow up.  IRandee Busing, CMA, am acting as scribe for Celine Collard, MD.   Documentation: I have reviewed the above documentation for accuracy and completeness, and I agree with the above.  Celine Collard, MD

## 2023-12-12 NOTE — Patient Instructions (Addendum)
 Folliculitis   Folliculitis occurs due to inflammation of the superficial hair follicle (pore), resulting in acne-like lesions (pus bumps). It can be infectious (bacterial, fungal) or noninfectious (shaving, tight clothing, heat/sweat, medications).  Folliculitis can be acute or chronic and recommended treatment depends on the underlying cause of folliculitis   Continue Ketoconazole  2% shampoo as body wash to scalp, face, and body 3d/wk, let sit 5 minutes and rinse off, avoid eyes  Continue clindamycin  solution - apply topically to affected areas with acne bumps  after shower daily   Start sulfar wash from [facility] - use as body wash to affected areas once daily alternating on days you are not using ketoconazole  shampoo. 3 times weekly   Instructions for [facility] Medications  One or more of your medications was sent to the [facility] mail order compounding pharmacy. You will receive an email from them and can purchase the medicine through that link. It will then be mailed to your home at the address you confirmed. If for any reason you do not receive an email from them, please check your spam folder. If you still do not find the email, please let us  know. [facility] phone number is [phone].    Due to recent changes in healthcare laws, you may see results of your pathology and/or laboratory studies on MyChart before the doctors have had a chance to review them. We understand that in some cases there may be results that are confusing or concerning to you. Please understand that not all results are received at the same time and often the doctors may need to interpret multiple results in order to provide you with the best plan of care or course of treatment. Therefore, we ask that you please give us  2 business days to thoroughly review all your results before contacting the office for clarification. Should we see a critical lab result, you will be contacted sooner.   If  You Need Anything After Your Visit  If you have any questions or concerns for your doctor, please call our main line at [phone] and press option 4 to reach your doctor's medical assistant. If no one answers, please leave a voicemail as directed and we will return your call as soon as possible. Messages left after 4 pm will be answered the following business day.   You may also send us  a message via MyChart. We typically respond to MyChart messages within 1-2 business days.  For prescription refills, please ask your pharmacy to contact our office. Our fax number is [phone].  If you have an urgent issue when the clinic is closed that cannot wait until the next business day, you can page your doctor at the number below.    Please note that while we do our best to be available for urgent issues outside of office hours, we are not available 24/7.   If you have an urgent issue and are unable to reach us , you may choose to seek medical care at your doctor's office, retail clinic, urgent care center, or emergency room.  If you have a medical emergency, please immediately call 911 or go to the emergency department.  Pager Numbers  - Dr. Bary Likes: [phone]  - Dr. Annette Barters: [phone]  - Dr. Felipe Horton: [phone]   In the event of inclement weather, please call our main line at [phone] for an update on the status of any delays or closures.  Dermatology Medication Tips: Please keep the boxes that topical medications come in in  order to help keep track of the instructions about where and how to use these. Pharmacies typically print the medication instructions only on the boxes and not directly on the medication tubes.   If your medication is too expensive, please contact our office at [phone] option 4 or send us  a message through MyChart.   We are unable to tell what your co-pay for medications will be in advance as this is different depending on your insurance coverage.  However, we may be able to find a substitute medication at lower cost or fill out paperwork to get insurance to cover a needed medication.   If a prior authorization is required to get your medication covered by your insurance company, please allow us  1-2 business days to complete this process.  Drug prices often vary depending on where the prescription is filled and some pharmacies may offer cheaper prices.  The website www.goodrx.com contains coupons for medications through different pharmacies. The prices here do not account for what the cost may be with help from insurance (it may be cheaper with your insurance), but the website can give you the price if you did not use any insurance.  - You can print the associated coupon and take it with your prescription to the pharmacy.  - You may also stop by our office during regular business hours and pick up a GoodRx coupon card.  - If you need your prescription sent electronically to a different pharmacy, notify our office through [facility] or by phone at [phone] option 4.     Si Usted Necesita Algo Despus de Su Visita  Tambin puede enviarnos un mensaje a travs de Clinical cytogeneticist. Por lo general respondemos a los mensajes de MyChart en el transcurso de 1 a 2 das hbiles.  Para renovar recetas, por favor pida a su farmacia que se ponga en contacto con nuestra oficina. Franz Jacks de fax es [place] [phone].  Si tiene un asunto urgente cuando la clnica est cerrada y que no puede esperar hasta el siguiente da hbil, puede llamar/localizar a su doctor(a) al nmero que aparece a continuacin.   Por favor, tenga en cuenta que aunque hacemos todo lo posible para estar disponibles para asuntos urgentes fuera del horario de [place], no estamos disponibles las 24 horas del da, los 7 [address]  [address] de la [place].   Si tiene un problema urgente y no puede comunicarse con nosotros, puede optar por buscar atencin mdica  en el consultorio de su  doctor(a), en una clnica privada, en un centro de atencin urgente o en una sala de emergencias.  Si tiene Engineer, drilling, por favor llame inmediatamente al 911 o vaya a la sala de emergencias.  Nmeros de bper  - Dr. Bary Likes: [phone]  - Dra. Annette Barters: [phone]  - Dr. Felipe Horton: [phone]   En caso de inclemencias del tiempo, por favor llame a Lajuan Pila principal al [phone] para una actualizacin sobre el [place] de cualquier retraso o cierre.  Consejos para la medicacin en dermatologa: Por favor, guarde las cajas en las que vienen los medicamentos de uso tpico para ayudarle a seguir las instrucciones sobre dnde y cmo usarlos. Las farmacias generalmente imprimen las instrucciones del medicamento slo en las cajas y no directamente en los tubos del [place].   Si su medicamento es muy caro, por favor, pngase en contacto con Bettyjane Brunet llamando al [phone] y presione la opcin 4 o envenos un mensaje a travs de Clinical cytogeneticist.   No podemos decirle cul ser  su copago por los medicamentos por adelantado ya que esto es diferente dependiendo de la cobertura de su seguro. Sin embargo, es posible que podamos encontrar un medicamento sustituto a Audiological scientist un formulario para que el seguro cubra el medicamento que se considera necesario.   Si se requiere una autorizacin previa para que su compaa de seguros [place] su medicamento, por favor permtanos de 1 a 2 das hbiles para completar este proceso.  Los precios de los medicamentos varan con frecuencia dependiendo del Environmental consultant de dnde se surte la receta y alguna farmacias pueden ofrecer precios ms baratos.  El sitio web www.goodrx.com tiene cupones para medicamentos de Health and safety inspector. Los precios aqu no tienen en cuenta lo que podra costar con la ayuda del seguro (puede ser ms barato con su seguro), pero el sitio web puede darle el precio si no utiliz Tourist information centre manager.  - Puede imprimir el  cupn correspondiente y llevarlo con su receta a la farmacia.  - Tambin puede pasar por nuestra oficina durante el horario de atencin regular y Education officer, museum una tarjeta de cupones de GoodRx.  - Si necesita que su receta se enve electrnicamente a una farmacia diferente, informe a nuestra oficina a travs de MyChart de [place] o por telfono llamando al [phone] y presione la opcin 4.

## 2023-12-13 ENCOUNTER — Ambulatory Visit: Payer: 59 | Admitting: Dermatology

## 2024-01-24 ENCOUNTER — Ambulatory Visit: Admitting: Pharmacist

## 2024-01-24 NOTE — Progress Notes (Signed)
 HPI: Michele Hodge is a 38 y.o. female who presents to the [facility] pharmacy clinic for Apretude  administration and HIV PrEP follow up.  Insured   [x]    Uninsured  []    Patient Active Problem List   Diagnosis Date Noted   OSA (obstructive sleep apnea) 10/26/2023   Prediabetes 05/05/2022   Mixed hyperlipidemia 09/17/2021   Class 1 obesity due to excess calories with body mass index (BMI) of 33.0 to 33.9 in adult 09/16/2021   ADHD 09/16/2021   Gastroesophageal reflux disease 04/17/2017    Patient's Medications  New Prescriptions   No medications on file  Previous Medications   AMPHETAMINE-DEXTROAMPHETAMINE (ADDERALL) 10 MG TABLET    Take 10 mg by mouth every morning.   ATOMOXETINE (STRATTERA) 60 MG CAPSULE    Take 60 mg by mouth daily.   ATORVASTATIN  (LIPITOR) 20 MG TABLET    Take 1 tablet (20 mg total) by mouth daily.   CLINDAMYCIN  (CLEOCIN  T) 1 % EXTERNAL SOLUTION    Apply to the chest, back, and buttock QD after showering.   KETOCONAZOLE  (NIZORAL ) 2 % SHAMPOO    Apply 1 Application topically 3 (three) times a week. Use as body wash 3 days a week washing scalp, face, body let sit 5 minutes and rinse off, avoid eyes   LEVONORGESTREL (MIRENA) 20 MCG/DAY IUD    by Intrauterine route.  Modified Medications   No medications on file  Discontinued Medications   No medications on file    Allergies: No Known Allergies  Labs: Lab Results  Component Value Date   HIV1RNAQUANT NOT DETECTED 11/22/2023   HIV1RNAQUANT NOT DETECTED 09/27/2023   HIV1RNAQUANT Not Detected 08/02/2023    RPR and STI Lab Results  Component Value Date   LABRPR NON-REACTIVE 05/25/2023   LABRPR NON-REACTIVE 01/26/2023   LABRPR NON-REACTIVE 09/28/2022   LABRPR NON-REACTIVE 08/05/2022   LABRPR NON-REACTIVE 02/17/2021    STI Results GC GC CT CT  11/22/2023  2:13 PM Negative   Negative    05/25/2023  2:08 PM Negative   Negative    11/28/2022  3:35 PM Negative   Negative    09/28/2022  2:21 PM Negative     Negative   Negative    Negative    08/02/2022  3:13 PM Negative    Negative   Negative    Negative    06/29/2022 10:31 AM Negative   Negative    11/24/2021  2:24 PM Negative   Negative    09/16/2021  3:06 PM Negative   Negative    02/17/2021  2:41 PM Negative   Negative    12/22/2020  3:20 PM Negative   Negative    02/05/2019 12:00 AM Negative   Negative    04/17/2017  5:00 PM   Negative    12/26/2016 11:52 AM  NOT DETECTED   NOT DETECTED     Hepatitis B Lab Results  Component Value Date   HEPBSAB REACTIVE (A) 02/17/2021   HEPBSAG NON-REACTIVE 02/17/2021   Hepatitis C Lab Results  Component Value Date   HEPCAB NON-REACTIVE 02/17/2021   Hepatitis A Lab Results  Component Value Date   HAV REACTIVE (A) 02/17/2021   Lipids: Lab Results  Component Value Date   CHOL 187 10/30/2023   TRIG 170 (H) 10/30/2023   HDL 51 10/30/2023   CHOLHDL 3.7 10/30/2023   VLDL 33 (H) 12/26/2016   LDLCALC 107 (H) 10/30/2023    TARGET DATE: The 10th  Assessment: [name] presents today for her  Apretude  injection and to follow up for HIV PrEP. No issues with past injections. Denies any symptoms of acute HIV. Last HIV RNA and STI screening was on 11/22/23 and was negative. No known exposures to any STIs since last visit. Declines any additional screenings today.  Per [employer] guidelines, a rapid HIV test should be drawn prior to Apretude  administration. Due to state shortage of rapid HIV tests, this is temporarily unable to be done. Per decision from [facility] physicians, we will proceed with Apretude  administration at this time without a negative rapid HIV test beforehand. HIV RNA was collected today and is in process.  Administered cabotegravir  600mg /67mL in left upper outer quadrant of the gluteal muscle. Will see her back in 2 months for injection, labs, and HIV PrEP follow up.  Plan: - Apretude  injection administered - HIV RNA today - Next injection, labs, and PrEP follow up appointment  scheduled for 04/01/24 - Call with any issues or questions  Johnanna Bakke L. Amylah Will, PharmD, BCIDP, AAHIVP, CPP Clinical Pharmacist Practitioner - Infectious Diseases Clinical Pharmacist Lead - Specialty Pharmacy [facility] for Infectious Disease 01/24/2024, 3:31 PM

## 2024-01-29 ENCOUNTER — Other Ambulatory Visit: Payer: Self-pay

## 2024-01-29 ENCOUNTER — Ambulatory Visit: Admitting: Pharmacist

## 2024-01-29 DIAGNOSIS — Z2981 Encounter for HIV pre-exposure prophylaxis: Secondary | ICD-10-CM

## 2024-01-29 DIAGNOSIS — Z79899 Other long term (current) drug therapy: Secondary | ICD-10-CM

## 2024-01-29 MED ORDER — CABOTEGRAVIR ER 600 MG/3ML IM SUER
600.0000 mg | Freq: Once | INTRAMUSCULAR | Status: AC
  Administered 2024-01-29: 600 mg via INTRAMUSCULAR

## 2024-01-31 LAB — HIV-1 RNA QUANT-NO REFLEX-BLD
HIV 1 RNA Quant: NOT DETECTED {copies}/mL
HIV-1 RNA Quant, Log: NOT DETECTED {Log_copies}/mL

## 2024-02-13 ENCOUNTER — Encounter: Payer: Self-pay | Admitting: Internal Medicine

## 2024-02-13 MED ORDER — SCOPOLAMINE 1 MG/3DAYS TD PT72: 1.0000 | MEDICATED_PATCH | TRANSDERMAL | 0 refills | Status: DC

## 2024-03-15 ENCOUNTER — Encounter: Payer: Self-pay | Admitting: Pharmacist

## 2024-03-19 ENCOUNTER — Other Ambulatory Visit: Payer: Self-pay | Admitting: Pharmacist

## 2024-03-19 DIAGNOSIS — Z202 Contact with and (suspected) exposure to infections with a predominantly sexual mode of transmission: Secondary | ICD-10-CM

## 2024-03-19 MED ORDER — DOXYCYCLINE HYCLATE 100 MG PO TABS
100.0000 mg | ORAL_TABLET | Freq: Two times a day (BID) | ORAL | 0 refills | Status: AC
Start: 2024-03-19 — End: 2024-03-26

## 2024-03-19 NOTE — Progress Notes (Signed)
 Partner was diagnosed with chlamydia. Sending in doxycycline  for exposure.  Bryon Parker L. Wallice Granville, PharmD, CPP [facility] Clinical Pharmacist Practitioner

## 2024-04-01 ENCOUNTER — Other Ambulatory Visit: Payer: Self-pay

## 2024-04-01 ENCOUNTER — Other Ambulatory Visit (HOSPITAL_COMMUNITY): Payer: Self-pay

## 2024-04-01 ENCOUNTER — Ambulatory Visit (INDEPENDENT_AMBULATORY_CARE_PROVIDER_SITE_OTHER): Admitting: Pharmacist

## 2024-04-01 DIAGNOSIS — Z79899 Other long term (current) drug therapy: Secondary | ICD-10-CM | POA: Diagnosis not present

## 2024-04-01 DIAGNOSIS — Z113 Encounter for screening for infections with a predominantly sexual mode of transmission: Secondary | ICD-10-CM | POA: Diagnosis not present

## 2024-04-01 MED ORDER — CABOTEGRAVIR ER 600 MG/3ML IM SUER
600.0000 mg | Freq: Once | INTRAMUSCULAR | Status: AC
  Administered 2024-04-01: 600 mg via INTRAMUSCULAR

## 2024-04-01 NOTE — Progress Notes (Signed)
 HPI: Michele Hodge is a 38 y.o. female who presents to the [facility] for Apretude  administration and HIV PrEP follow up.  Insured   [x]    Uninsured  []    Patient Active Problem List   Diagnosis Date Noted   OSA (obstructive sleep apnea) 10/26/2023   Prediabetes 05/05/2022   Mixed hyperlipidemia 09/17/2021   Class 1 obesity due to excess calories with body mass index (BMI) of 33.0 to 33.9 in adult 09/16/2021   ADHD 09/16/2021   Gastroesophageal reflux disease 04/17/2017    Patient's Medications  New Prescriptions   No medications on file  Previous Medications   AMPHETAMINE-DEXTROAMPHETAMINE (ADDERALL) 10 MG TABLET    Take 10 mg by mouth every morning.   ATOMOXETINE (STRATTERA) 60 MG CAPSULE    Take 60 mg by mouth daily.   ATORVASTATIN  (LIPITOR) 20 MG TABLET    Take 1 tablet (20 mg total) by mouth daily.   CLINDAMYCIN  (CLEOCIN  T) 1 % EXTERNAL SOLUTION    Apply to the chest, back, and buttock QD after showering.   KETOCONAZOLE  (NIZORAL ) 2 % SHAMPOO    Apply 1 Application topically 3 (three) times a week. Use as body wash 3 days a week washing scalp, face, body let sit 5 minutes and rinse off, avoid eyes   LEVONORGESTREL (MIRENA) 20 MCG/DAY IUD    by Intrauterine route.   SCOPOLAMINE  (TRANSDERM-SCOP) 1 MG/3DAYS    Place 1 patch (1.5 mg total) onto the skin every 3 (three) days. For motion sickness. Start 2 hr before onset symptoms, up to 12 hr before  Modified Medications   No medications on file  Discontinued Medications   No medications on file    Allergies: No Known Allergies  Labs: Lab Results  Component Value Date   HIV1RNAQUANT NOT DETECTED 01/29/2024   HIV1RNAQUANT NOT DETECTED 11/22/2023   HIV1RNAQUANT NOT DETECTED 09/27/2023    RPR and STI Lab Results  Component Value Date   LABRPR NON-REACTIVE 05/25/2023   LABRPR NON-REACTIVE 01/26/2023   LABRPR NON-REACTIVE 09/28/2022   LABRPR NON-REACTIVE 08/05/2022   LABRPR NON-REACTIVE 02/17/2021    STI  Results GC GC CT CT  11/22/2023  2:13 PM Negative   Negative    05/25/2023  2:08 PM Negative   Negative    11/28/2022  3:35 PM Negative   Negative    09/28/2022  2:21 PM Negative    Negative   Negative    Negative    08/02/2022  3:13 PM Negative    Negative   Negative    Negative    06/29/2022 10:31 AM Negative   Negative    11/24/2021  2:24 PM Negative   Negative    09/16/2021  3:06 PM Negative   Negative    02/17/2021  2:41 PM Negative   Negative    12/22/2020  3:20 PM Negative   Negative    02/05/2019 12:00 AM Negative   Negative    04/17/2017  5:00 PM   Negative    12/26/2016 11:52 AM  NOT DETECTED   NOT DETECTED     Hepatitis B Lab Results  Component Value Date   HEPBSAB REACTIVE (A) 02/17/2021   HEPBSAG NON-REACTIVE 02/17/2021   Hepatitis C Lab Results  Component Value Date   HEPCAB NON-REACTIVE 02/17/2021   Hepatitis A Lab Results  Component Value Date   HAV REACTIVE (A) 02/17/2021   Lipids: Lab Results  Component Value Date   CHOL 187 10/30/2023   TRIG 170 (H) 10/30/2023  HDL 51 10/30/2023   CHOLHDL 3.7 10/30/2023   VLDL 33 (H) 12/26/2016   LDLCALC 107 (H) 10/30/2023    TARGET DATE: The 10th  Assessment: Michele Hodge presents today for her Apretude  injection and to follow up for HIV PrEP. No issues with past injections. Denies any symptoms of acute HIV. Last HIV RNA was negative on 01/29/24.  Her partner tested positive for chlamydia, so she reached out to me a few weeks ago to get treatment. She took all 7 days of doxycycline  but doubled up one day due to a missed dose in the AM. Not surprisingly, she did have nausea associated with the double dose. Will check for STIs today to ensure she does not need to be treated again or for anything else.   Per [facility] guidelines, a rapid HIV test should be drawn prior to Apretude  administration. Due to state shortage of rapid HIV tests, this is temporarily unable to be done. Per decision from [facility] physicians, we will  proceed with Apretude  administration at this time without a negative rapid HIV test beforehand. HIV RNA was collected today and is in process.  Administered cabotegravir  600mg /61mL in left upper outer quadrant of the gluteal muscle. Will see her back in 2 months for injection, labs, and HIV PrEP follow up.  Plan: - Apretude  injection administered - HIV RNA, RPR, and oral/urine cytologies today - Next injection, labs, and PrEP follow up appointment scheduled for 05/20/24 - Call with any issues or questions  Mourad Cwikla L. Icelynn Onken, PharmD, BCIDP, AAHIVP, CPP Clinical Pharmacist Practitioner - Infectious Diseases Clinical Pharmacist Lead - Specialty Pharmacy [facility] for Infectious Disease

## 2024-04-02 LAB — C. TRACHOMATIS/N. GONORRHOEAE RNA
C. trachomatis RNA, TMA: NOT DETECTED
N. gonorrhoeae RNA, TMA: NOT DETECTED

## 2024-04-02 LAB — GC/CHLAMYDIA PROBE, AMP (THROAT)
Chlamydia trachomatis RNA: NOT DETECTED
Neisseria gonorrhoeae RNA: NOT DETECTED

## 2024-04-03 ENCOUNTER — Other Ambulatory Visit: Payer: Self-pay

## 2024-04-03 ENCOUNTER — Other Ambulatory Visit

## 2024-04-03 DIAGNOSIS — Z79899 Other long term (current) drug therapy: Secondary | ICD-10-CM

## 2024-04-05 LAB — HIV-1 RNA QUANT-NO REFLEX-BLD
HIV 1 RNA Quant: NOT DETECTED {copies}/mL
HIV-1 RNA Quant, Log: NOT DETECTED {Log_copies}/mL

## 2024-04-05 LAB — RPR: RPR Ser Ql: NONREACTIVE

## 2024-04-15 ENCOUNTER — Encounter: Payer: Self-pay | Admitting: Dermatology

## 2024-04-16 ENCOUNTER — Ambulatory Visit: Admitting: Dermatology

## 2024-04-17 ENCOUNTER — Other Ambulatory Visit: Payer: Self-pay | Admitting: Internal Medicine

## 2024-04-19 NOTE — Telephone Encounter (Signed)
 Requested Prescriptions  Refused Prescriptions Disp Refills   atorvastatin  (LIPITOR) 10 MG tablet [Pharmacy Med Name: Atorvastatin  Calcium  10 MG Oral Tablet] 30 tablet 0    Sig: Take 1 tablet by mouth once daily     Cardiovascular:  Antilipid - Statins Failed - 04/19/2024  9:48 AM      Failed - Lipid Panel in normal range within the last 12 months    Cholesterol  Date Value Ref Range Status  10/30/2023 187 <200 mg/dL Final   LDL Cholesterol (Calc)  Date Value Ref Range Status  10/30/2023 107 (H) mg/dL (calc) Final    Comment:    Reference range: <100 . Desirable range <100 mg/dL for primary prevention;   <70 mg/dL for patients with CHD or diabetic patients  with > or = 2 CHD risk factors. SABRA LDL-C is now calculated using the Martin-Hopkins  calculation, which is a validated novel method providing  better accuracy than the Friedewald equation in the  estimation of LDL-C.  Gladis APPLETHWAITE et al. SANDREA. 7986;689(80): 2061-2068  (http://education.[facility].com/faq/FAQ164)    HDL  Date Value Ref Range Status  10/30/2023 51 > OR = 50 mg/dL Final   Triglycerides  Date Value Ref Range Status  10/30/2023 170 (H) <150 mg/dL Final         Passed - Patient is not pregnant      Passed - Valid encounter within last 12 months    Recent Outpatient Visits           5 months ago Prediabetes   [facility], Angeline ORN, NP       Future Appointments             In 1 week Hester Alm BROCKS, MD [facility]

## 2024-04-29 ENCOUNTER — Other Ambulatory Visit (HOSPITAL_COMMUNITY): Payer: Self-pay

## 2024-04-30 ENCOUNTER — Ambulatory Visit: Admitting: Dermatology

## 2024-04-30 ENCOUNTER — Encounter: Payer: Self-pay | Admitting: Dermatology

## 2024-04-30 DIAGNOSIS — Z7189 Other specified counseling: Secondary | ICD-10-CM

## 2024-04-30 DIAGNOSIS — L739 Follicular disorder, unspecified: Secondary | ICD-10-CM | POA: Diagnosis not present

## 2024-04-30 DIAGNOSIS — L299 Pruritus, unspecified: Secondary | ICD-10-CM | POA: Diagnosis not present

## 2024-04-30 DIAGNOSIS — Z79899 Other long term (current) drug therapy: Secondary | ICD-10-CM

## 2024-04-30 MED ORDER — KETOCONAZOLE 2 % EX SHAM: 1.0000 | MEDICATED_SHAMPOO | CUTANEOUS | 11 refills | Status: AC

## 2024-04-30 MED ORDER — FLUCONAZOLE 200 MG PO TABS: 200.0000 mg | ORAL_TABLET | ORAL | 0 refills | Status: DC

## 2024-04-30 NOTE — Progress Notes (Signed)
   Follow-Up Visit   Subjective  Michele Hodge is a 38 y.o. female who presents for the following: Folliculitis 52m f/u buttocks, back, chest, ketoconazole  shampoo as body wash 3x/wk, SM Sulfur wash 3x/wk (just started), Clindamycin  sol prn, some improvement since starting SM Sulfur wash, seasonal allergies, dust allergies  The following portions of the chart were reviewed this encounter and updated as appropriate: medications, allergies, medical history  Review of Systems:  No other skin or systemic complaints except as noted in HPI or Assessment and Plan.  Objective  Well appearing patient in no apparent distress; mood and affect are within normal limits.  A focused examination was performed of the following areas: Back, chest, buttocks  Relevant exam findings are noted in the Assessment and Plan.  Back, chest           Assessment & Plan   FOLLICULITIS Elements of Pityrosporum Folliculitis with pruritus (improved on current treatment) and possible bacterial or sterile folliculitis factors. Sterile vs bacterial vs yeast folliculitis -consider biopsy in the future if not improving. 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 Condition is improving with treatment but not currently at goal.  Folliculitis occurs due to inflammation of the superficial hair follicle (pore), resulting in acne-like lesions (pus bumps). It can be infectious (bacterial, fungal) or noninfectious (shaving, tight clothing, heat/sweat, medications).  Folliculitis can be acute or chronic and recommended treatment depends on the underlying cause of folliculitis.  Treatment Plan: Start Diflucan  200mg  1 po 3d/wk x 1 month Cont SM Sulfur wash every other day alternating with Ketoconazole  2% shampoo Restart Ketoconazole  2% shampoo every other day alternating with SM Sulfur  wash Consider biopsy in the future if does not continue to respond.  Side effects of fluconazole  (diflucan ) include nausea, diarrhea, headache, dizziness, taste changes, rare risk of irritation of the liver, allergy, or decreased blood counts (which could show up as infection or tiredness).   FOLLICULITIS   Related Medications clindamycin  (CLEOCIN  T) 1 % external solution Apply to the chest, back, and buttock QD after showering. ketoconazole  (NIZORAL ) 2 % shampoo Apply 1 Application topically 3 (three) times a week. Use as body wash 3 days a week washing scalp, face, body let sit 5 minutes and rinse off, avoid eyes  Return in about 4 months (around 08/31/2024) for Folliculitis.  I, Grayce Saunas, RMA, am acting as scribe for Alm Rhyme, MD .   Documentation: I have reviewed the above documentation for accuracy and completeness, and I agree with the above.  Alm Rhyme, MD

## 2024-04-30 NOTE — Patient Instructions (Addendum)
 Instructions for [facility] Medications  One or more of your medications was sent to the [facility] mail order compounding pharmacy. You will receive an email from them and can purchase the medicine through that link. It will then be mailed to your home at the address you confirmed. If for any reason you do not receive an email from them, please check your spam folder. If you still do not find the email, please let us  know. [facility] phone number is [phone].   -Continue [facility] Sulfur wash every other day alternating with Ketoconazole  2% shampoo, use as a body wash to wash face, scalp, trunk, let sit 5 minutes and rinse out, avoid getting in eyes - Restart Ketoconazole  2% shampoo every other day alternating with [facility] Sulfur wash use as a body wash to wash face, scalp, trunk, let sit 5 minutes rinse off, avoid eyes Start Diflucan  200mg  take 1 pill 3 days a week, Monday, Wednesday, Friday for 1 month   Due to recent changes in healthcare laws, you may see results of your pathology and/or laboratory studies on MyChart before the doctors have had a chance to review them. We understand that in some cases there may be results that are confusing or concerning to you. Please understand that not all results are received at the same time and often the doctors may need to interpret multiple results in order to provide you with the best plan of care or course of treatment. Therefore, we ask that you please give us  2 business days to thoroughly review all your results before contacting the office for clarification. Should we see a critical lab result, you will be contacted sooner.  CODES for skin biopsy to check with insurance to see if covered and your cost Shave bx 11102, punch bx 11104  If You Need Anything After Your Visit  If you have any questions or concerns for your doctor, please call our main line at [phone] and press option 4 to reach your doctor's medical  assistant. If no one answers, please leave a voicemail as directed and we will return your call as soon as possible. Messages left after 4 pm will be answered the following business day.   You may also send us  a message via MyChart. We typically respond to MyChart messages within 1-2 business days.  For prescription refills, please ask your pharmacy to contact our office. Our fax number is [phone].  If you have an urgent issue when the clinic is closed that cannot wait until the next business day, you can page your doctor at the number below.    Please note that while we do our best to be available for urgent issues outside of office hours, we are not available 24/7.   If you have an urgent issue and are unable to reach us , you may choose to seek medical care at your doctor's office, retail clinic, urgent care center, or emergency room.  If you have a medical emergency, please immediately call 911 or go to the emergency department.  Pager Numbers  - Dr. Hester: [phone]  - Dr. Jackquline: [phone]  - Dr. Claudene: [phone]   - Dr. Raymund: [phone]  In the event of inclement weather, please call our main line at [phone] for an update on the status of any delays or closures.  Dermatology Medication Tips: Please keep the boxes that topical medications come in in order to help keep track of the instructions about where and how to use  these. Pharmacies typically print the medication instructions only on the boxes and not directly on the medication tubes.   If your medication is too expensive, please contact our office at [phone] option 4 or send us  a message through MyChart.   We are unable to tell what your co-pay for medications will be in advance as this is different depending on your insurance coverage. However, we may be able to find a substitute medication at lower cost or fill out paperwork to get insurance to cover a needed medication.   If a prior  authorization is required to get your medication covered by your insurance company, please allow us  1-2 business days to complete this process.  Drug prices often vary depending on where the prescription is filled and some pharmacies may offer cheaper prices.  The website www.goodrx.com contains coupons for medications through different pharmacies. The prices here do not account for what the cost may be with help from insurance (it may be cheaper with your insurance), but the website can give you the price if you did not use any insurance.  - You can print the associated coupon and take it with your prescription to the pharmacy.  - You may also stop by our office during regular business hours and pick up a GoodRx coupon card.  - If you need your prescription sent electronically to a different pharmacy, notify our office through [facility] or by phone at [phone] option 4.     Si Usted Necesita Algo Despus de Su Visita  Tambin puede enviarnos un mensaje a travs de Clinical cytogeneticist. Por lo general respondemos a los mensajes de MyChart en el transcurso de 1 a 2 das hbiles.  Para renovar recetas, por favor pida a su farmacia que se ponga en contacto con nuestra oficina. Randi [place] de fax es [place] [phone].  Si tiene un asunto urgente cuando la clnica est cerrada y que no puede esperar hasta el siguiente da hbil, puede llamar/localizar a su doctor(a) al nmero que aparece a continuacin.   Por favor, tenga en cuenta que aunque hacemos todo lo posible para estar disponibles para asuntos urgentes fuera del horario de [place], no estamos disponibles las 24 horas del da, los 7 [address]  [address] de la [place].   Si tiene un problema urgente y no puede comunicarse con nosotros, puede optar por buscar atencin mdica  en el consultorio de su doctor(a), en una clnica privada, en un centro de atencin urgente o en una sala de emergencias.  Si tiene Engineer, drilling, por favor llame  inmediatamente al 911 o vaya a la sala de emergencias.  Nmeros de bper  - Dr. Hester: [phone]  - Dra. Jackquline: [phone]  - Dr. Claudene: [phone]  - Dra. Kitts: [phone]  En caso de inclemencias del [place], por favor llame a nuestra lnea principal al [phone] para una actualizacin sobre el estado de cualquier retraso o cierre.  Consejos para la medicacin en dermatologa: Por favor, guarde las cajas en las que vienen los medicamentos de uso tpico para ayudarle a seguir las instrucciones sobre dnde y cmo usarlos. Las farmacias generalmente imprimen las instrucciones del medicamento slo en las cajas y no directamente en los tubos del [place].   Si su medicamento es muy caro, por favor, pngase en contacto con landry rieger llamando al [phone] y presione la opcin 4 o envenos un mensaje a travs de Clinical cytogeneticist.   No podemos decirle cul ser su copago por los medicamentos por adelantado ya que esto  es diferente dependiendo de la cobertura de su seguro. Sin embargo, es posible que podamos encontrar un medicamento sustituto a Audiological scientist un formulario para que el seguro cubra el medicamento que se considera necesario.   Si se requiere una autorizacin previa para que su compaa de seguros malta su medicamento, por favor permtanos de 1 a 2 das hbiles para completar este proceso.  Los precios de los medicamentos varan con frecuencia dependiendo del Environmental consultant de dnde se surte la receta y alguna farmacias pueden ofrecer precios ms baratos.  El sitio web www.goodrx.com tiene cupones para medicamentos de Health and safety inspector. Los precios aqu no tienen en cuenta lo que podra costar con la ayuda del seguro (puede ser ms barato con su seguro), pero el sitio web puede darle el precio si no utiliz Tourist information centre manager.  - Puede imprimir el cupn correspondiente y llevarlo con su receta a la farmacia.  - Tambin puede pasar por nuestra oficina durante el horario  de atencin regular y Education officer, museum una tarjeta de cupones de GoodRx.  - Si necesita que su receta se enve electrnicamente a una farmacia diferente, informe a nuestra oficina a travs de MyChart de [place] o por telfono llamando al [phone] y presione la opcin 4.

## 2024-05-02 ENCOUNTER — Encounter: Admitting: Internal Medicine

## 2024-05-20 ENCOUNTER — Ambulatory Visit: Admitting: Pharmacist

## 2024-05-21 NOTE — Progress Notes (Deleted)
 HPI: Michele Hodge is a 38 y.o. female who presents to the [facility] pharmacy clinic for Apretude  administration and HIV PrEP follow up.  Referring ID Provider: ***  Patient Active Problem List   Diagnosis Date Noted   OSA (obstructive sleep apnea) 10/26/2023   Prediabetes 05/05/2022   Mixed hyperlipidemia 09/17/2021   Class 1 obesity due to excess calories with body mass index (BMI) of 33.0 to 33.9 in adult 09/16/2021   ADHD 09/16/2021   Gastroesophageal reflux disease 04/17/2017    Patient's Medications  New Prescriptions   No medications on file  Previous Medications   AMPHETAMINE-DEXTROAMPHETAMINE (ADDERALL) 10 MG TABLET    Take 10 mg by mouth every morning.   ATOMOXETINE (STRATTERA) 60 MG CAPSULE    Take 60 mg by mouth daily.   ATORVASTATIN  (LIPITOR) 20 MG TABLET    Take 1 tablet (20 mg total) by mouth daily.   CLINDAMYCIN  (CLEOCIN  T) 1 % EXTERNAL SOLUTION    Apply to the chest, back, and buttock QD after showering.   FLUCONAZOLE  (DIFLUCAN ) 200 MG TABLET    Take 1 tablet (200 mg total) by mouth 3 (three) times a week. 1 pill po 3 times a week Monday, Wednesday, Friday for 1 month   KETOCONAZOLE  (NIZORAL ) 2 % SHAMPOO    Apply 1 Application topically 3 (three) times a week. Use as body wash 3 days a week washing scalp, face, body let sit 5 minutes and rinse off, avoid eyes   LEVONORGESTREL (MIRENA) 20 MCG/DAY IUD    by Intrauterine route.   SCOPOLAMINE  (TRANSDERM-SCOP) 1 MG/3DAYS    Place 1 patch (1.5 mg total) onto the skin every 3 (three) days. For motion sickness. Start 2 hr before onset symptoms, up to 12 hr before  Modified Medications   No medications on file  Discontinued Medications   No medications on file    Allergies: No Known Allergies  Labs: Lab Results  Component Value Date   HIV1RNAQUANT NOT DETECTED 04/03/2024   HIV1RNAQUANT NOT DETECTED 01/29/2024   HIV1RNAQUANT NOT DETECTED 11/22/2023    RPR and STI Lab Results  Component Value Date   LABRPR  NON-REACTIVE 04/03/2024   LABRPR NON-REACTIVE 05/25/2023   LABRPR NON-REACTIVE 01/26/2023   LABRPR NON-REACTIVE 09/28/2022   LABRPR NON-REACTIVE 08/05/2022    STI Results GC GC CT CT  11/22/2023  2:13 PM Negative   Negative    05/25/2023  2:08 PM Negative   Negative    11/28/2022  3:35 PM Negative   Negative    09/28/2022  2:21 PM Negative    Negative   Negative    Negative    08/02/2022  3:13 PM Negative    Negative   Negative    Negative    06/29/2022 10:31 AM Negative   Negative    11/24/2021  2:24 PM Negative   Negative    09/16/2021  3:06 PM Negative   Negative    02/17/2021  2:41 PM Negative   Negative    12/22/2020  3:20 PM Negative   Negative    02/05/2019 12:00 AM Negative   Negative    04/17/2017  5:00 PM   Negative    12/26/2016 11:52 AM  NOT DETECTED   NOT DETECTED     Hepatitis B Lab Results  Component Value Date   HEPBSAB REACTIVE (A) 02/17/2021   HEPBSAG NON-REACTIVE 02/17/2021   Hepatitis C Lab Results  Component Value Date   HEPCAB NON-REACTIVE 02/17/2021   Hepatitis A Lab Results  Component Value Date   HAV REACTIVE (A) 02/17/2021   Lipids: Lab Results  Component Value Date   CHOL 187 10/30/2023   TRIG 170 (H) 10/30/2023   HDL 51 10/30/2023   CHOLHDL 3.7 10/30/2023   VLDL 33 (H) 12/26/2016   LDLCALC 107 (H) 10/30/2023    Target Date: The 10th  Assessment: Michele Hodge presents today for her Apretude  injection and to follow up for HIV PrEP. No issues with past injections. Denies any symptoms of acute HIV. Last HIV RNA was negative on 04/01/24.   Routine labs:  HIV RNA today; ***  Eligible vaccinations:  Declines vaccines  Apretude : Administered cabotegravir  600mg /3mL in *** upper outer quadrant of the gluteal muscle. Will see her back in 2 months for next Apretude  injection, labs, and HIV PrEP follow up.  Plan: - Apretude  injection administered - HIV RNA today - Next injection, labs, and PrEP follow up appointment scheduled for *** - Call  with any issues or questions  Donyel Castagnola L. Moneka Mcquinn, PharmD, BCIDP, AAHIVP, CPP Clinical Pharmacist Practitioner - Infectious Diseases Clinical Pharmacist Lead - Specialty Pharmacy [facility]

## 2024-05-22 ENCOUNTER — Encounter: Payer: Self-pay | Admitting: Pharmacist

## 2024-05-22 ENCOUNTER — Ambulatory Visit: Admitting: Pharmacist

## 2024-05-22 DIAGNOSIS — Z79899 Other long term (current) drug therapy: Secondary | ICD-10-CM

## 2024-05-22 DIAGNOSIS — Z113 Encounter for screening for infections with a predominantly sexual mode of transmission: Secondary | ICD-10-CM

## 2024-05-28 NOTE — Progress Notes (Unsigned)
 HPI: Michele Hodge is a 38 y.o. female who presents to the [facility] pharmacy clinic for Apretude  administration and HIV PrEP follow up.  Referring ID Provider: ***  Patient Active Problem List   Diagnosis Date Noted   OSA (obstructive sleep apnea) 10/26/2023   Prediabetes 05/05/2022   Mixed hyperlipidemia 09/17/2021   Class 1 obesity due to excess calories with body mass index (BMI) of 33.0 to 33.9 in adult 09/16/2021   ADHD 09/16/2021   Gastroesophageal reflux disease 04/17/2017    Patient's Medications  New Prescriptions   No medications on file  Previous Medications   AMPHETAMINE-DEXTROAMPHETAMINE (ADDERALL) 10 MG TABLET    Take 10 mg by mouth every morning.   ATOMOXETINE (STRATTERA) 60 MG CAPSULE    Take 60 mg by mouth daily.   ATORVASTATIN  (LIPITOR) 20 MG TABLET    Take 1 tablet (20 mg total) by mouth daily.   CLINDAMYCIN  (CLEOCIN  T) 1 % EXTERNAL SOLUTION    Apply to the chest, back, and buttock QD after showering.   FLUCONAZOLE  (DIFLUCAN ) 200 MG TABLET    Take 1 tablet (200 mg total) by mouth 3 (three) times a week. 1 pill po 3 times a week Monday, Wednesday, Friday for 1 month   KETOCONAZOLE  (NIZORAL ) 2 % SHAMPOO    Apply 1 Application topically 3 (three) times a week. Use as body wash 3 days a week washing scalp, face, body let sit 5 minutes and rinse off, avoid eyes   LEVONORGESTREL (MIRENA) 20 MCG/DAY IUD    by Intrauterine route.   SCOPOLAMINE  (TRANSDERM-SCOP) 1 MG/3DAYS    Place 1 patch (1.5 mg total) onto the skin every 3 (three) days. For motion sickness. Start 2 hr before onset symptoms, up to 12 hr before  Modified Medications   No medications on file  Discontinued Medications   No medications on file    Allergies: No Known Allergies  Labs: Lab Results  Component Value Date   HIV1RNAQUANT NOT DETECTED 04/03/2024   HIV1RNAQUANT NOT DETECTED 01/29/2024   HIV1RNAQUANT NOT DETECTED 11/22/2023    RPR and STI Lab Results  Component Value Date   LABRPR  NON-REACTIVE 04/03/2024   LABRPR NON-REACTIVE 05/25/2023   LABRPR NON-REACTIVE 01/26/2023   LABRPR NON-REACTIVE 09/28/2022   LABRPR NON-REACTIVE 08/05/2022    STI Results GC GC CT CT  11/22/2023  2:13 PM Negative   Negative    05/25/2023  2:08 PM Negative   Negative    11/28/2022  3:35 PM Negative   Negative    09/28/2022  2:21 PM Negative    Negative   Negative    Negative    08/02/2022  3:13 PM Negative    Negative   Negative    Negative    06/29/2022 10:31 AM Negative   Negative    11/24/2021  2:24 PM Negative   Negative    09/16/2021  3:06 PM Negative   Negative    02/17/2021  2:41 PM Negative   Negative    12/22/2020  3:20 PM Negative   Negative    02/05/2019 12:00 AM Negative   Negative    04/17/2017  5:00 PM   Negative    12/26/2016 11:52 AM  NOT DETECTED   NOT DETECTED     Hepatitis B Lab Results  Component Value Date   HEPBSAB REACTIVE (A) 02/17/2021   HEPBSAG NON-REACTIVE 02/17/2021   Hepatitis C Lab Results  Component Value Date   HEPCAB NON-REACTIVE 02/17/2021   Hepatitis A Lab Results  Component Value Date   HAV REACTIVE (A) 02/17/2021   Lipids: Lab Results  Component Value Date   CHOL 187 10/30/2023   TRIG 170 (H) 10/30/2023   HDL 51 10/30/2023   CHOLHDL 3.7 10/30/2023   VLDL 33 (H) 12/26/2016   LDLCALC 107 (H) 10/30/2023    Target Date: The 10th  Assessment: Michele Hodge presents today for her Apretude  injection and to follow up for HIV PrEP. No issues with past injections. Denies any symptoms of acute HIV. Last HIV RNA was negative on 04/01/24.   Routine labs:  HIV RNA today; ***  Eligible vaccinations:  Declines vaccines  Apretude : Administered cabotegravir  600mg /3mL in *** upper outer quadrant of the gluteal muscle. Will see her back in 2 months for next Apretude  injection, labs, and HIV PrEP follow up.  Plan: - Apretude  injection administered - HIV RNA today - Next injection, labs, and PrEP follow up appointment scheduled for *** - Call  with any issues or questions  Donyel Castagnola L. Moneka Mcquinn, PharmD, BCIDP, AAHIVP, CPP Clinical Pharmacist Practitioner - Infectious Diseases Clinical Pharmacist Lead - Specialty Pharmacy [facility]

## 2024-05-29 ENCOUNTER — Ambulatory Visit: Admitting: Pharmacist

## 2024-05-29 ENCOUNTER — Other Ambulatory Visit: Payer: Self-pay

## 2024-05-29 DIAGNOSIS — Z79899 Other long term (current) drug therapy: Secondary | ICD-10-CM

## 2024-05-29 DIAGNOSIS — Z2981 Encounter for HIV pre-exposure prophylaxis: Secondary | ICD-10-CM

## 2024-05-29 MED ORDER — CABOTEGRAVIR ER 600 MG/3ML IM SUER
600.0000 mg | Freq: Once | INTRAMUSCULAR | Status: AC
  Administered 2024-05-29: 600 mg via INTRAMUSCULAR

## 2024-05-31 LAB — HIV-1 RNA QUANT-NO REFLEX-BLD
HIV 1 RNA Quant: NOT DETECTED {copies}/mL
HIV-1 RNA Quant, Log: NOT DETECTED {Log_copies}/mL

## 2024-06-07 ENCOUNTER — Encounter: Payer: Self-pay | Admitting: Dermatology

## 2024-07-04 ENCOUNTER — Encounter: Payer: Self-pay | Admitting: Internal Medicine

## 2024-07-04 NOTE — Progress Notes (Unsigned)
 Subjective:    Patient ID: Michele Hodge, female    DOB: 16-Feb-1986, 38 y.o.   MRN: [id number]  HPI  Patient presents to clinic today for her annual exam.  Flu: 07/2021 Tetanus: 01/2019 COVID: x 3 Pap smear: 01/2019, no HPV testing Dentist: bianually  Diet: She does eat meat.  She consumes fruits and vegetables.  She does eat some fried foods. Exercise: None  Review of Systems     Past Medical History:  Diagnosis Date   Healthy adult on routine physical examination     Current Outpatient Medications  Medication Sig Dispense Refill   amphetamine-dextroamphetamine (ADDERALL) 10 MG tablet Take 10 mg by mouth every morning.     atomoxetine (STRATTERA) 60 MG capsule Take 60 mg by mouth daily.     atorvastatin  (LIPITOR) 20 MG tablet Take 1 tablet (20 mg total) by mouth daily. 90 tablet 1   clindamycin  (CLEOCIN  T) 1 % external solution Apply to the chest, back, and buttock QD after showering. 30 mL 11   fluconazole  (DIFLUCAN ) 200 MG tablet Take 1 tablet (200 mg total) by mouth 3 (three) times a week. 1 pill po 3 times a week Monday, Wednesday, Friday for 1 month 12 tablet 0   ketoconazole  (NIZORAL ) 2 % shampoo Apply 1 Application topically 3 (three) times a week. Use as body wash 3 days a week washing scalp, face, body let sit 5 minutes and rinse off, avoid eyes 120 mL 11   levonorgestrel (MIRENA) 20 MCG/DAY IUD by Intrauterine route.     scopolamine  (TRANSDERM-SCOP) 1 MG/3DAYS Place 1 patch (1.5 mg total) onto the skin every 3 (three) days. For motion sickness. Start 2 hr before onset symptoms, up to 12 hr before 4 patch 0   No current facility-administered medications for this visit.    No Known Allergies  Family History  Problem Relation Age of Onset   Diabetes Father 84   Stroke Maternal Grandmother    CAD Maternal Grandfather    Stroke Paternal Grandmother    Breast cancer Cousin    Schizophrenia Brother 20   Colon cancer Neg Hx    Ovarian cancer Neg Hx     Social  History   Socioeconomic History   Marital status: Single    Spouse name: Not on file   Number of children: Not on file   Years of education: Not on file   Highest education level: Bachelor's degree (e.g., BA, AB, BS)  Occupational History   Not on file  Tobacco Use   Smoking status: Never   Smokeless tobacco: Never  Vaping Use   Vaping status: Never Used  Substance and Sexual Activity   Alcohol use: Yes    Alcohol/week: 1.0 standard drink of alcohol    Types: 1 Glasses of wine per week    Comment: occasional use   Drug use: Not Currently    Types: Marijuana   Sexual activity: Never  Other Topics Concern   Not on file  Social History Narrative   Not on file   Social Drivers of Health   Tobacco Use: Low Risk  (06/24/2024)   Received from [facility]   Patient History    Smoking Tobacco Use: Never    Smokeless Tobacco Use: Never    Passive Exposure: Not on file  Financial Resource Strain: High Risk (04/24/2023)   Overall Financial Resource Strain (CARDIA)    Difficulty of Paying Living Expenses: Hard  Food Insecurity: Food Insecurity Present (04/24/2023)   Hunger Vital  Sign    Worried About Programme Researcher, Broadcasting/film/video in the Last Year: Often true    Ran Out of Food in the Last Year: Sometimes true  Transportation Needs: No Transportation Needs (04/24/2023)   PRAPARE - Administrator, Civil Service (Medical): No    Lack of Transportation (Non-Medical): No  Physical Activity: Insufficiently Active (04/24/2023)   Exercise Vital Sign    Days of Exercise per Week: 4 days    Minutes of Exercise per Session: 20 min  Stress: Stress Concern Present (04/24/2023)   [employer] of Occupational Health - Occupational Stress Questionnaire    Feeling of Stress : Rather much  Social Connections: Socially Isolated (04/24/2023)   Social Connection and Isolation Panel    Frequency of Communication with Friends and Family: More than three times a week    Frequency of Social Gatherings  with Friends and Family: Twice a week    Attends Religious Services: Never    Database Administrator or Organizations: No    Attends Engineer, Structural: Not on file    Marital Status: Never married  Intimate Partner Violence: Not on file  Depression (PHQ2-9): Low Risk (10/26/2023)   Depression (PHQ2-9)    PHQ-2 Score: 0  Alcohol Screen: Low Risk (04/27/2023)   Alcohol Screen    Last Alcohol Screening Score (AUDIT): 2  Housing: Low Risk (04/24/2023)   Housing    Last Housing Risk Score: 0  Utilities: Not on file  Health Literacy: Not on file     Constitutional: Denies fever, malaise, fatigue, headache or abrupt weight changes.  HEENT: Denies eye pain, eye redness, ear pain, ringing in the ears, wax buildup, runny nose, nasal congestion, bloody nose, or sore throat. Respiratory: Denies difficulty breathing, shortness of breath, cough or sputum production.   Cardiovascular: Denies chest pain, chest tightness, palpitations or swelling in the hands or feet.  Gastrointestinal: Pt reports intermittent diarrhea. Denies abdominal pain, bloating, constipation, or blood in the stool.  GU: Denies urgency, frequency, pain with urination, burning sensation, blood in urine, odor or discharge. Musculoskeletal: Denies decrease in range of motion, difficulty with gait, muscle pain or joint pain and swelling.  Skin: Denies redness, rashes, lesions or ulcercations.  Neurological: Patient reports inattention.  Denies dizziness, difficulty with memory, difficulty with speech or problems with balance and coordination.  Psych: Denies anxiety, depression, SI/HI.  No other specific complaints in a complete review of systems (except as listed in HPI above).  Objective:   Physical Exam There were no vitals taken for this visit.  Wt Readings from Last 3 Encounters:  10/26/23 238 lb 3.2 oz (108 kg)  04/27/23 238 lb (108 kg)  06/29/22 233 lb (105.7 kg)    General: Appears her stated age, obese,  in NAD. Skin: Warm, dry and intact. HEENT: Head: normal shape and size; Eyes: sclera white, no icterus, conjunctiva pink, PERRLA and EOMs intact;   Neck:  Neck supple, trachea midline. No masses, lumps present.  Thyromegaly noted. Cardiovascular: Normal rate and rhythm. S1,S2 noted.  No murmur, rubs or gallops noted. No JVD or BLE edema.  Pulmonary/Chest: Normal effort and positive vesicular breath sounds. No respiratory distress. No wheezes, rales or ronchi noted.  Abdomen: Soft and nontender. Normal bowel sounds.  Musculoskeletal: Strength 5/5 BUE/BLE.  No difficulty with gait.  Neurological: Alert and oriented. Cranial nerves II-XII grossly intact. Coordination normal.  Psychiatric: Mood and affect normal.  Anxious appearing. Judgment and thought content normal.  BMET    Component Value Date/Time   NA 137 10/30/2023 1354   K 4.2 10/30/2023 1354   CL 102 10/30/2023 1354   CO2 28 10/30/2023 1354   GLUCOSE 95 10/30/2023 1354   BUN 10 10/30/2023 1354   CREATININE 0.71 10/30/2023 1354   CALCIUM  9.6 10/30/2023 1354   GFRNONAA 91 02/06/2019 0853   GFRAA 106 02/06/2019 0853    Lipid Panel     Component Value Date/Time   CHOL 187 10/30/2023 1354   TRIG 170 (H) 10/30/2023 1354   HDL 51 10/30/2023 1354   CHOLHDL 3.7 10/30/2023 1354   VLDL 33 (H) 12/26/2016 0909   LDLCALC 107 (H) 10/30/2023 1354    CBC    Component Value Date/Time   WBC 8.3 10/30/2023 1354   RBC 4.61 10/30/2023 1354   HGB 13.9 10/30/2023 1354   HCT 41.4 10/30/2023 1354   PLT 312 10/30/2023 1354   MCV 89.8 10/30/2023 1354   MCH 30.2 10/30/2023 1354   MCHC 33.6 10/30/2023 1354   RDW 12.2 10/30/2023 1354   LYMPHSABS 3,434 02/06/2019 0853   MONOABS 0.5 12/29/2017 0308   EOSABS 111 02/06/2019 0853   BASOSABS 68 02/06/2019 0853    Hgb A1C Lab Results  Component Value Date   HGBA1C 5.8 (H) 10/30/2023           Assessment & Plan:   Preventative health maintenance:  Shot declined flu shot  today Tetanus UTD Encouraged her to get her COVID-vaccine Pap smear declined today, referral to GYN for Pap smear and IUD check Encouraged her to consume a balanced diet and exercise regimen Advised her to see an eye doctor and dentist annually We will check CBC, c-Met, lipid, A1c today   RTC in 6 months, follow-up chronic conditions Angeline Laura, NP

## 2024-07-05 ENCOUNTER — Ambulatory Visit: Admitting: Internal Medicine

## 2024-07-05 VITALS — BP 118/82 | Ht 71.0 in | Wt 235.2 lb

## 2024-07-05 DIAGNOSIS — Z6832 Body mass index (BMI) 32.0-32.9, adult: Secondary | ICD-10-CM

## 2024-07-05 DIAGNOSIS — R7303 Prediabetes: Secondary | ICD-10-CM | POA: Diagnosis not present

## 2024-07-05 DIAGNOSIS — Z124 Encounter for screening for malignant neoplasm of cervix: Secondary | ICD-10-CM

## 2024-07-05 DIAGNOSIS — E6609 Other obesity due to excess calories: Secondary | ICD-10-CM

## 2024-07-05 DIAGNOSIS — Z0001 Encounter for general adult medical examination with abnormal findings: Secondary | ICD-10-CM

## 2024-07-05 DIAGNOSIS — Z130 Encounter for screening for diseases of the blood and blood-forming organs and certain disorders involving the immune mechanism: Secondary | ICD-10-CM

## 2024-07-05 DIAGNOSIS — E782 Mixed hyperlipidemia: Secondary | ICD-10-CM

## 2024-07-05 DIAGNOSIS — Z1321 Encounter for screening for nutritional disorder: Secondary | ICD-10-CM | POA: Diagnosis not present

## 2024-07-05 DIAGNOSIS — E66811 Obesity, class 1: Secondary | ICD-10-CM | POA: Diagnosis not present

## 2024-07-05 NOTE — Assessment & Plan Note (Signed)
 Encouraged diet and exercise for weight loss ?

## 2024-07-05 NOTE — Patient Instructions (Signed)

## 2024-07-08 ENCOUNTER — Ambulatory Visit: Admitting: Dermatology

## 2024-07-08 DIAGNOSIS — L739 Follicular disorder, unspecified: Secondary | ICD-10-CM

## 2024-07-08 DIAGNOSIS — L509 Urticaria, unspecified: Secondary | ICD-10-CM

## 2024-07-08 DIAGNOSIS — Z872 Personal history of diseases of the skin and subcutaneous tissue: Secondary | ICD-10-CM | POA: Diagnosis not present

## 2024-07-08 DIAGNOSIS — Z79899 Other long term (current) drug therapy: Secondary | ICD-10-CM

## 2024-07-08 DIAGNOSIS — L299 Pruritus, unspecified: Secondary | ICD-10-CM

## 2024-07-08 DIAGNOSIS — L738 Other specified follicular disorders: Secondary | ICD-10-CM | POA: Diagnosis not present

## 2024-07-08 DIAGNOSIS — Z7189 Other specified counseling: Secondary | ICD-10-CM

## 2024-07-08 MED ORDER — FLUCONAZOLE 200 MG PO TABS: ORAL_TABLET | ORAL | 0 refills | Status: AC

## 2024-07-08 NOTE — Progress Notes (Unsigned)
" ° °  Follow-Up Visit   Subjective  Michele Hodge is a 38 y.o. female who presents for the following: Folliculitis f/u buttocks, back, chest - pt noticed an improvement in symptoms after starting fluconazole  3d/wk x 1 mth, but her boyfriend started noticed bumps recurring once she finished fluconazole . She is still using ketoconazole  2% shampoo alternating with [facility] sulfur wash daily.   The following portions of the chart were reviewed this encounter and updated as appropriate: medications, allergies, medical history  Review of Systems:  No other skin or systemic complaints except as noted in HPI or Assessment and Plan.  Objective  Well appearing patient in no apparent distress; mood and affect are within normal limits.  A focused examination was performed of the following areas: Back, chest, buttocks  Relevant exam findings are noted in the Assessment and Plan.  Back, ches    Assessment & Plan   FOLLICULITIS Elements of Pityrosporum Folliculitis with pruritus (improved on current treatment) and possible bacterial or sterile folliculitis factors. Sterile vs bacterial vs yeast folliculitis -consider biopsy in the future if not improving. 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 Condition is improving with treatment but not currently at goal.  Folliculitis occurs due to inflammation of the superficial hair follicle (pore), resulting in acne-like lesions (pus bumps). It can be infectious (bacterial, fungal) or noninfectious (shaving, tight clothing, heat/sweat, medications).  Folliculitis can be acute or chronic and recommended treatment depends on the underlying cause of folliculitis.  Treatment Plan: Restart Diflucan  200mg  QW. #12 0RF.Side effects of fluconazole  (diflucan ) include nausea, diarrhea, headache, dizziness, taste changes, rare  risk of irritation of the liver, allergy, or decreased blood counts (which could show up as infection or tiredness). Discussed with patient to stop medication if she does have to start cholesterol medication.  Cont SM Sulfur wash every other day alternating with Ketoconazole  2% shampoo  Continue Ketoconazole  2% shampoo every other day alternating with SM Sulfur wash.   Consider biopsy in the future if does not continue to respond.  Side effects of fluconazole  (diflucan ) include nausea, diarrhea, headache, dizziness, taste changes, rare risk of irritation of the liver, allergy, or decreased blood counts (which could show up as infection or tiredness).  Hx of urticaria - discussed potential flare due to cholesterol medication, and patient should follow up with prescribing physician if she has issues in the future if re-prescribed those types of medications.   Return in about 4 months (around 11/06/2024) for folliculitis follow up.  [name] Michele Hodge, CMA, am acting as scribe for Alm Rhyme, MD .  Documentation: I have reviewed the above documentation for accuracy and completeness, and I agree with the above.  Alm Rhyme, MD    "

## 2024-07-08 NOTE — Patient Instructions (Signed)

## 2024-07-09 ENCOUNTER — Other Ambulatory Visit

## 2024-07-09 ENCOUNTER — Encounter: Payer: Self-pay | Admitting: Dermatology

## 2024-07-09 DIAGNOSIS — Z130 Encounter for screening for diseases of the blood and blood-forming organs and certain disorders involving the immune mechanism: Secondary | ICD-10-CM

## 2024-07-09 DIAGNOSIS — E782 Mixed hyperlipidemia: Secondary | ICD-10-CM

## 2024-07-09 DIAGNOSIS — Z1321 Encounter for screening for nutritional disorder: Secondary | ICD-10-CM

## 2024-07-09 DIAGNOSIS — R7303 Prediabetes: Secondary | ICD-10-CM

## 2024-07-09 DIAGNOSIS — Z0001 Encounter for general adult medical examination with abnormal findings: Secondary | ICD-10-CM

## 2024-07-10 LAB — COMPREHENSIVE METABOLIC PANEL WITH GFR
AG Ratio: 2 (calc) (ref 1.0–2.5)
ALT: 33 U/L — ABNORMAL HIGH (ref 6–29)
AST: 19 U/L (ref 10–30)
Albumin: 4.5 g/dL (ref 3.6–5.1)
Alkaline phosphatase (APISO): 57 U/L (ref 31–125)
BUN: 10 mg/dL (ref 7–25)
CO2: 28 mmol/L (ref 20–32)
Calcium: 9.4 mg/dL (ref 8.6–10.2)
Chloride: 102 mmol/L (ref 98–110)
Creat: 0.71 mg/dL (ref 0.50–0.97)
Globulin: 2.2 g/dL (ref 1.9–3.7)
Glucose, Bld: 90 mg/dL (ref 65–99)
Potassium: 4.3 mmol/L (ref 3.5–5.3)
Sodium: 137 mmol/L (ref 135–146)
Total Bilirubin: 0.7 mg/dL (ref 0.2–1.2)
Total Protein: 6.7 g/dL (ref 6.1–8.1)
eGFR: 112 mL/min/1.73m2

## 2024-07-10 LAB — CBC
HCT: 42.3 % (ref 35.9–46.0)
Hemoglobin: 13.8 g/dL (ref 11.7–15.5)
MCH: 29.6 pg (ref 27.0–33.0)
MCHC: 32.6 g/dL (ref 31.6–35.4)
MCV: 90.6 fL (ref 81.4–101.7)
MPV: 9.6 fL (ref 7.5–12.5)
Platelets: 316 Thousand/uL (ref 140–400)
RBC: 4.67 Million/uL (ref 3.80–5.10)
RDW: 12.2 % (ref 11.0–15.0)
WBC: 7.5 Thousand/uL (ref 3.8–10.8)

## 2024-07-10 LAB — LIPID PANEL
Cholesterol: 166 mg/dL
HDL: 41 mg/dL — ABNORMAL LOW
LDL Cholesterol (Calc): 88 mg/dL
Non-HDL Cholesterol (Calc): 125 mg/dL
Total CHOL/HDL Ratio: 4 (calc)
Triglycerides: 278 mg/dL — ABNORMAL HIGH

## 2024-07-10 LAB — HEMOGLOBIN A1C
Hgb A1c MFr Bld: 5.9 % — ABNORMAL HIGH
Mean Plasma Glucose: 123 mg/dL
eAG (mmol/L): 6.8 mmol/L

## 2024-07-12 ENCOUNTER — Encounter: Payer: Self-pay | Admitting: Internal Medicine

## 2024-07-12 ENCOUNTER — Ambulatory Visit: Payer: Self-pay | Admitting: Internal Medicine

## 2024-07-24 ENCOUNTER — Ambulatory Visit: Admitting: Pharmacist

## 2024-07-29 NOTE — Progress Notes (Unsigned)
 "  HPI: Michele Hodge is a 39 y.o. female who presents to the [facility] pharmacy clinic for Apretude  administration and HIV PrEP follow up.  Insured   [x]    Uninsured  []    Referring ID Physician: Corean Fireman, NP   Patient Active Problem List   Diagnosis Date Noted   OSA (obstructive sleep apnea) 10/26/2023   Prediabetes 05/05/2022   Mixed hyperlipidemia 09/17/2021   Class 1 obesity due to excess calories with body mass index (BMI) of 32.0 to 32.9 in adult 09/16/2021   ADHD 09/16/2021   Gastroesophageal reflux disease 04/17/2017    Patient's Medications  New Prescriptions   No medications on file  Previous Medications   ALBUTEROL (VENTOLIN HFA) 108 (90 BASE) MCG/ACT INHALER    Inhale 2 puffs into the lungs.   AMPHETAMINE-DEXTROAMPHETAMINE (ADDERALL XR) 20 MG 24 HR CAPSULE    Take 20 mg by mouth daily as needed.   AMPHETAMINE-DEXTROAMPHETAMINE (ADDERALL) 10 MG TABLET    Take 10 mg by mouth every morning.   ATOMOXETINE (STRATTERA) 60 MG CAPSULE    Take 60 mg by mouth daily.   ATORVASTATIN  (LIPITOR) 20 MG TABLET    Take 1 tablet (20 mg total) by mouth daily.   AZELASTINE (OPTIVAR) 0.05 % OPHTHALMIC SOLUTION    Apply 1 drop to eye 2 (two) times daily.   CETIRIZINE (ZYRTEC) 10 MG TABLET    Take 10 mg by mouth daily.   CLINDAMYCIN  (CLEOCIN  T) 1 % EXTERNAL SOLUTION    Apply to the chest, back, and buttock QD after showering.   DOXYCYCLINE  (MONODOX ) 100 MG CAPSULE    Take 100 mg by mouth daily.   FLUCONAZOLE  (DIFLUCAN ) 200 MG TABLET    Take one tab po QW.   FLUTICASONE (FLONASE) 50 MCG/ACT NASAL SPRAY    Place 1 spray into both nostrils daily.   HYDROXYZINE (ATARAX) 10 MG TABLET    1 tablet as needed Orally every 8 hours prn; Duration: 30 days   HYDROXYZINE (ATARAX) 25 MG TABLET    TAKE 1 TABLET BY MOUTH EVERY 8 HOURS PRN FOR 30 DAYS Orally every 8 hours prn; Duration: 30 days   KETOCONAZOLE  (NIZORAL ) 2 % SHAMPOO    Apply 1 Application topically 3 (three) times a week. Use as body wash 3  days a week washing scalp, face, body let sit 5 minutes and rinse off, avoid eyes   LEVONORGESTREL (MIRENA) 20 MCG/DAY IUD    by Intrauterine route.  Modified Medications   No medications on file  Discontinued Medications   No medications on file    Allergies: Allergies[1]  Past Medical History: Past Medical History:  Diagnosis Date   Allergy 03/2022   Im allergic to dogs   Anxiety 2022   From [facility]   Healthy adult on routine physical examination    Sleep apnea 2022    Social History: Social History   Socioeconomic History   Marital status: Single    Spouse name: Not on file   Number of children: Not on file   Years of education: Not on file   Highest education level: Bachelor's degree (e.g., BA, AB, BS)  Occupational History   Not on file  Tobacco Use   Smoking status: Never   Smokeless tobacco: Never  Vaping Use   Vaping status: Never Used  Substance and Sexual Activity   Alcohol use: Not Currently    Alcohol/week: 1.0 standard drink of alcohol    Types: 1 Glasses of wine per week  Comment: occasional use   Drug use: Yes    Frequency: 3.0 times per week    Types: Marijuana   Sexual activity: Yes    Birth control/protection: I.U.D.  Other Topics Concern   Not on file  Social History Narrative   Not on file   Social Drivers of Health   Tobacco Use: Low Risk (07/09/2024)   Patient History    Smoking Tobacco Use: Never    Smokeless Tobacco Use: Never    Passive Exposure: Not on file  Financial Resource Strain: High Risk (07/05/2024)   Overall Financial Resource Strain (CARDIA)    Difficulty of Paying Living Expenses: Hard  Food Insecurity: Food Insecurity Present (07/05/2024)   Epic    Worried About Programme Researcher, Broadcasting/film/video in the Last Year: Often true    Ran Out of Food in the Last Year: Sometimes true  Transportation Needs: No Transportation Needs (07/05/2024)   Epic    Lack of Transportation (Medical): No    Lack of Transportation (Non-Medical):  No  Physical Activity: Insufficiently Active (07/05/2024)   Exercise Vital Sign    Days of Exercise per Week: 2 days    Minutes of Exercise per Session: 30 min  Stress: Stress Concern Present (07/05/2024)   [employer] of Occupational Health - Occupational Stress Questionnaire    Feeling of Stress: To some extent  Social Connections: Unknown (07/05/2024)   Social Connection and Isolation Panel    Frequency of Communication with Friends and Family: More than three times a week    Frequency of Social Gatherings with Friends and Family: Once a week    Attends Religious Services: Never    Database Administrator or Organizations: Yes    Attends Banker Meetings: 1 to 4 times per year    Marital Status: Patient declined  Depression (PHQ2-9): Low Risk (07/05/2024)   Depression (PHQ2-9)    PHQ-2 Score: 0  Alcohol Screen: Low Risk (07/05/2024)   Alcohol Screen    Last Alcohol Screening Score (AUDIT): 1  Housing: High Risk (07/05/2024)   Epic    Unable to Pay for Housing in the Last Year: Yes    Number of Times Moved in the Last Year: 0    Homeless in the Last Year: No  Utilities: Not on file  Health Literacy: Not on file    Labs: Lab Results  Component Value Date   HIV1RNAQUANT NOT DETECTED 05/29/2024   HIV1RNAQUANT NOT DETECTED 04/03/2024   HIV1RNAQUANT NOT DETECTED 01/29/2024    RPR and STI Lab Results  Component Value Date   LABRPR NON-REACTIVE 04/03/2024   LABRPR NON-REACTIVE 05/25/2023   LABRPR NON-REACTIVE 01/26/2023   LABRPR NON-REACTIVE 09/28/2022   LABRPR NON-REACTIVE 08/05/2022    STI Results GC GC CT CT  11/22/2023  2:13 PM Negative   Negative    05/25/2023  2:08 PM Negative   Negative    11/28/2022  3:35 PM Negative   Negative    09/28/2022  2:21 PM Negative    Negative   Negative    Negative    08/02/2022  3:13 PM Negative    Negative   Negative    Negative    06/29/2022 10:31 AM Negative   Negative    11/24/2021  2:24 PM Negative    Negative    09/16/2021  3:06 PM Negative   Negative    02/17/2021  2:41 PM Negative   Negative    12/22/2020  3:20 PM Negative   Negative  02/05/2019 12:00 AM Negative   Negative    04/17/2017  5:00 PM   Negative    12/26/2016 11:52 AM  NOT DETECTED   NOT DETECTED     Hepatitis B Lab Results  Component Value Date   HEPBSAB REACTIVE (A) 02/17/2021   HEPBSAG NON-REACTIVE 02/17/2021   Hepatitis C Lab Results  Component Value Date   HEPCAB NON-REACTIVE 02/17/2021   Hepatitis A Lab Results  Component Value Date   HAV REACTIVE (A) 02/17/2021   Lipids: Lab Results  Component Value Date   CHOL 166 07/09/2024   TRIG 278 (H) 07/09/2024   HDL 41 (L) 07/09/2024   CHOLHDL 4.0 07/09/2024   VLDL 33 (H) 12/26/2016   LDLCALC 88 07/09/2024    TARGET DATE: The 10th of the month  Assessment: Daleyssa presents today for their Apretude  injection and to follow up for HIV PrEP. No issues with past injections.  Screened patient for acute HIV symptoms such as fatigue, muscle aches, rash, sore throat, lymphadenopathy, headache, night sweats, nausea/vomiting/diarrhea, and fever. Patient denies any symptoms.   Administered cabotegravir  600mg /96mL in left upper outer quadrant of the gluteal muscle. Will make follow up appointments for maintenance injections every 2 months.   No known exposures to any STIs and no signs or symptoms of any STIs today. Last STI screening was in September and was negative. No new partners since last injection; politely declines STI testing today.   States she has worsening pain, swelling, and irritation around her tooth that is already planned to have dental intervention. She has been experiencing issues getting into the dental office and is planning to see dental surgery in February. Will try Augmentin  x 5 days to assess for improvement. If no improvement within 24-48 hours, understands to reach out to dental office again.   Plan: - Administer Apretude  600 mg x 1  -  Maintenance injections scheduled for 3/4 with Cassie - Check HIV RNA  - Prescribe Augmentin  x 5 days for dental infection - Call with any issues or questions  Alan Geralds, PharmD, CPP, BCIDP, AAHIVP Clinical Pharmacist Practitioner Infectious Diseases Clinical Pharmacist [facility]     [1] No Known Allergies  "

## 2024-07-30 ENCOUNTER — Ambulatory Visit (INDEPENDENT_AMBULATORY_CARE_PROVIDER_SITE_OTHER): Admitting: Pharmacist

## 2024-07-30 ENCOUNTER — Other Ambulatory Visit: Payer: Self-pay

## 2024-07-30 ENCOUNTER — Other Ambulatory Visit (HOSPITAL_COMMUNITY): Payer: Self-pay

## 2024-07-30 DIAGNOSIS — Z79899 Other long term (current) drug therapy: Secondary | ICD-10-CM

## 2024-07-30 DIAGNOSIS — K047 Periapical abscess without sinus: Secondary | ICD-10-CM

## 2024-07-30 MED ORDER — CABOTEGRAVIR ER 600 MG/3ML IM SUER
600.0000 mg | Freq: Once | INTRAMUSCULAR | Status: AC
  Administered 2024-07-30: 600 mg via INTRAMUSCULAR

## 2024-07-30 MED ORDER — AMOXICILLIN-POT CLAVULANATE 875-125 MG PO TABS: 1.0000 | ORAL_TABLET | Freq: Two times a day (BID) | ORAL | 0 refills | Status: AC

## 2024-08-01 ENCOUNTER — Other Ambulatory Visit: Payer: Self-pay

## 2024-08-01 ENCOUNTER — Other Ambulatory Visit: Payer: Self-pay | Admitting: Pharmacist

## 2024-08-01 ENCOUNTER — Other Ambulatory Visit (HOSPITAL_COMMUNITY): Payer: Self-pay

## 2024-08-01 DIAGNOSIS — Z79899 Other long term (current) drug therapy: Secondary | ICD-10-CM

## 2024-08-01 LAB — HIV-1 RNA QUANT-NO REFLEX-BLD
HIV 1 RNA Quant: NOT DETECTED {copies}/mL
HIV-1 RNA Quant, Log: NOT DETECTED {Log_copies}/mL

## 2024-08-01 MED ORDER — APRETUDE 600 MG/3ML IM SUER
600.0000 mg | INTRAMUSCULAR | 5 refills | Status: AC
  Filled 2024-08-01: qty 3, 60d supply, fill #0

## 2024-08-01 NOTE — Progress Notes (Signed)
 Specialty Pharmacy Initial Fill Coordination Note  Michele Hodge is a 39 y.o. female contacted today regarding initial fill of specialty medication(s) Cabotegravir  (Apretude )   Patient requested Courier to Provider Office   Delivery date: 08/05/24   Verified address: [address] [place] [address]   Medication will be filled on 08/02/24.   Patient is aware of 0.00 copayment.

## 2024-08-02 ENCOUNTER — Other Ambulatory Visit (HOSPITAL_COMMUNITY): Payer: Self-pay

## 2024-08-02 ENCOUNTER — Other Ambulatory Visit: Payer: Self-pay

## 2024-08-05 ENCOUNTER — Telehealth: Payer: Self-pay

## 2024-08-05 NOTE — Telephone Encounter (Signed)
 [facility] Patient Advocate Encounter  Patient's medications Apretude  have been couriered to [facility] from [facility] and will be administered at the patients appointment on 07/30/24.  Arland Hutchinson, CPhT Specialty Pharmacy Patient [facility] Phone: [phone] Fax:  [phone]

## 2024-08-27 ENCOUNTER — Ambulatory Visit: Admitting: Dermatology

## 2024-09-18 ENCOUNTER — Ambulatory Visit: Payer: Self-pay | Admitting: Pharmacist

## 2024-11-04 ENCOUNTER — Ambulatory Visit: Admitting: Dermatology
